# Patient Record
Sex: Female | Born: 1935 | Race: White | Hispanic: No | Smoking: Current every day smoker
Health system: Southern US, Community
[De-identification: ages and names within clinical notes are randomized; demographics above are authoritative.]

## PROBLEM LIST (undated history)

## (undated) ENCOUNTER — Emergency Department (HOSPITAL_COMMUNITY): Disposition: A | Discharge status: Home / Self Care | Payer: Medicare Other

## (undated) DIAGNOSIS — M1990 Unspecified osteoarthritis, unspecified site: Secondary | ICD-10-CM

## (undated) DIAGNOSIS — I639 Cerebral infarction, unspecified: Secondary | ICD-10-CM

## (undated) DIAGNOSIS — F0390 Unspecified dementia without behavioral disturbance: Secondary | ICD-10-CM

---

## 2005-09-27 ENCOUNTER — Emergency Department: Payer: Self-pay | Admitting: Emergency Medicine

## 2005-09-28 ENCOUNTER — Emergency Department (HOSPITAL_COMMUNITY): Admission: EM | Admit: 2005-09-28 | Discharge: 2005-09-28 | Payer: Self-pay | Admitting: Emergency Medicine

## 2005-09-29 ENCOUNTER — Emergency Department (HOSPITAL_COMMUNITY): Admission: EM | Admit: 2005-09-29 | Discharge: 2005-09-29 | Payer: Self-pay | Admitting: Emergency Medicine

## 2005-12-03 ENCOUNTER — Ambulatory Visit: Payer: Self-pay | Admitting: Internal Medicine

## 2008-08-30 ENCOUNTER — Ambulatory Visit: Payer: Self-pay | Admitting: Internal Medicine

## 2009-03-06 ENCOUNTER — Encounter: Admission: RE | Admit: 2009-03-06 | Discharge: 2009-03-06 | Payer: Self-pay | Admitting: Neurology

## 2009-05-20 ENCOUNTER — Ambulatory Visit: Payer: Self-pay | Admitting: Psychology

## 2009-07-18 ENCOUNTER — Inpatient Hospital Stay: Payer: Self-pay | Admitting: Internal Medicine

## 2013-05-03 DIAGNOSIS — M069 Rheumatoid arthritis, unspecified: Secondary | ICD-10-CM | POA: Insufficient documentation

## 2013-05-24 DIAGNOSIS — H3530 Unspecified macular degeneration: Secondary | ICD-10-CM | POA: Insufficient documentation

## 2014-01-26 ENCOUNTER — Ambulatory Visit: Payer: Self-pay | Admitting: Internal Medicine

## 2014-12-20 DIAGNOSIS — E559 Vitamin D deficiency, unspecified: Secondary | ICD-10-CM | POA: Insufficient documentation

## 2015-01-21 ENCOUNTER — Other Ambulatory Visit: Payer: Self-pay | Admitting: Physician Assistant

## 2015-01-21 ENCOUNTER — Ambulatory Visit
Admission: RE | Admit: 2015-01-21 | Discharge: 2015-01-21 | Disposition: A | Discharge status: Home / Self Care | Payer: Medicare Other | Source: Ambulatory Visit | Attending: Physician Assistant | Admitting: Physician Assistant

## 2015-01-21 ENCOUNTER — Ambulatory Visit: Payer: Self-pay

## 2015-01-21 DIAGNOSIS — S0990XA Unspecified injury of head, initial encounter: Secondary | ICD-10-CM

## 2015-01-21 DIAGNOSIS — G319 Degenerative disease of nervous system, unspecified: Secondary | ICD-10-CM | POA: Diagnosis not present

## 2015-01-21 DIAGNOSIS — I6782 Cerebral ischemia: Secondary | ICD-10-CM | POA: Diagnosis not present

## 2015-01-22 ENCOUNTER — Ambulatory Visit: Payer: Self-pay

## 2017-12-27 DIAGNOSIS — E441 Mild protein-calorie malnutrition: Secondary | ICD-10-CM | POA: Insufficient documentation

## 2018-12-28 ENCOUNTER — Observation Stay (HOSPITAL_COMMUNITY)
Admission: EM | Admit: 2018-12-28 | Discharge: 2018-12-29 | Disposition: A | Discharge status: Home / Self Care | Payer: Medicare Other | Attending: Student in an Organized Health Care Education/Training Program | Admitting: Student in an Organized Health Care Education/Training Program

## 2018-12-28 ENCOUNTER — Emergency Department (HOSPITAL_COMMUNITY): Payer: Medicare Other

## 2018-12-28 ENCOUNTER — Other Ambulatory Visit (HOSPITAL_COMMUNITY): Payer: Medicare Other

## 2018-12-28 ENCOUNTER — Observation Stay (HOSPITAL_COMMUNITY): Payer: Medicare Other

## 2018-12-28 ENCOUNTER — Encounter (HOSPITAL_COMMUNITY): Payer: Self-pay | Admitting: Neurology

## 2018-12-28 ENCOUNTER — Other Ambulatory Visit: Payer: Self-pay

## 2018-12-28 DIAGNOSIS — R4781 Slurred speech: Secondary | ICD-10-CM | POA: Diagnosis present

## 2018-12-28 DIAGNOSIS — I639 Cerebral infarction, unspecified: Secondary | ICD-10-CM | POA: Diagnosis not present

## 2018-12-28 DIAGNOSIS — M069 Rheumatoid arthritis, unspecified: Secondary | ICD-10-CM | POA: Diagnosis not present

## 2018-12-28 DIAGNOSIS — Z79899 Other long term (current) drug therapy: Secondary | ICD-10-CM | POA: Diagnosis not present

## 2018-12-28 DIAGNOSIS — R471 Dysarthria and anarthria: Secondary | ICD-10-CM | POA: Diagnosis not present

## 2018-12-28 DIAGNOSIS — F0150 Vascular dementia without behavioral disturbance: Secondary | ICD-10-CM | POA: Diagnosis present

## 2018-12-28 DIAGNOSIS — I6389 Other cerebral infarction: Secondary | ICD-10-CM | POA: Diagnosis not present

## 2018-12-28 DIAGNOSIS — F17200 Nicotine dependence, unspecified, uncomplicated: Secondary | ICD-10-CM | POA: Diagnosis present

## 2018-12-28 DIAGNOSIS — Z886 Allergy status to analgesic agent status: Secondary | ICD-10-CM | POA: Diagnosis not present

## 2018-12-28 DIAGNOSIS — R29810 Facial weakness: Secondary | ICD-10-CM | POA: Insufficient documentation

## 2018-12-28 DIAGNOSIS — Z20828 Contact with and (suspected) exposure to other viral communicable diseases: Secondary | ICD-10-CM | POA: Insufficient documentation

## 2018-12-28 DIAGNOSIS — M810 Age-related osteoporosis without current pathological fracture: Secondary | ICD-10-CM | POA: Insufficient documentation

## 2018-12-28 DIAGNOSIS — F0390 Unspecified dementia without behavioral disturbance: Secondary | ICD-10-CM | POA: Insufficient documentation

## 2018-12-28 HISTORY — DX: Unspecified osteoarthritis, unspecified site: M19.90

## 2018-12-28 HISTORY — DX: Unspecified dementia, unspecified severity, without behavioral disturbance, psychotic disturbance, mood disturbance, and anxiety: F03.90

## 2018-12-28 LAB — I-STAT CHEM 8, ED
BUN: 29 mg/dL — ABNORMAL HIGH (ref 8–23)
Calcium, Ion: 1.18 mmol/L (ref 1.15–1.40)
Chloride: 101 mmol/L (ref 98–111)
Creatinine, Ser: 1 mg/dL (ref 0.44–1.00)
Glucose, Bld: 124 mg/dL — ABNORMAL HIGH (ref 70–99)
HCT: 45 % (ref 36.0–46.0)
Hemoglobin: 15.3 g/dL — ABNORMAL HIGH (ref 12.0–15.0)
Potassium: 4.1 mmol/L (ref 3.5–5.1)
Sodium: 137 mmol/L (ref 135–145)
TCO2: 24 mmol/L (ref 22–32)

## 2018-12-28 LAB — URINALYSIS, ROUTINE W REFLEX MICROSCOPIC
Bacteria, UA: NONE SEEN
Bilirubin Urine: NEGATIVE
Glucose, UA: NEGATIVE mg/dL
Hgb urine dipstick: NEGATIVE
Ketones, ur: NEGATIVE mg/dL
Nitrite: NEGATIVE
Protein, ur: NEGATIVE mg/dL
Specific Gravity, Urine: 1.01 (ref 1.005–1.030)
pH: 6 (ref 5.0–8.0)

## 2018-12-28 LAB — COMPREHENSIVE METABOLIC PANEL
ALT: 14 U/L (ref 0–44)
AST: 19 U/L (ref 15–41)
Albumin: 3.5 g/dL (ref 3.5–5.0)
Alkaline Phosphatase: 63 U/L (ref 38–126)
Anion gap: 10 (ref 5–15)
BUN: 26 mg/dL — ABNORMAL HIGH (ref 8–23)
CO2: 22 mmol/L (ref 22–32)
Calcium: 9.3 mg/dL (ref 8.9–10.3)
Chloride: 104 mmol/L (ref 98–111)
Creatinine, Ser: 1.05 mg/dL — ABNORMAL HIGH (ref 0.44–1.00)
GFR calc Af Amer: 57 mL/min — ABNORMAL LOW (ref >60)
GFR calc non Af Amer: 49 mL/min — ABNORMAL LOW (ref >60)
Glucose, Bld: 133 mg/dL — ABNORMAL HIGH (ref 70–99)
Potassium: 4.2 mmol/L (ref 3.5–5.1)
Sodium: 136 mmol/L (ref 135–145)
Total Bilirubin: 0.4 mg/dL (ref 0.3–1.2)
Total Protein: 6.6 g/dL (ref 6.5–8.1)

## 2018-12-28 LAB — DIFFERENTIAL
Abs Immature Granulocytes: 0.02 10*3/uL (ref 0.00–0.07)
Basophils Absolute: 0.1 10*3/uL (ref 0.0–0.1)
Basophils Relative: 1 %
Eosinophils Absolute: 0.1 10*3/uL (ref 0.0–0.5)
Eosinophils Relative: 1 %
Immature Granulocytes: 0 %
Lymphocytes Relative: 25 %
Lymphs Abs: 1.9 10*3/uL (ref 0.7–4.0)
Monocytes Absolute: 0.6 10*3/uL (ref 0.1–1.0)
Monocytes Relative: 8 %
Neutro Abs: 4.9 10*3/uL (ref 1.7–7.7)
Neutrophils Relative %: 65 %

## 2018-12-28 LAB — PROTIME-INR
INR: 0.9 (ref 0.8–1.2)
Prothrombin Time: 12.4 seconds (ref 11.4–15.2)

## 2018-12-28 LAB — CBC
HCT: 44.9 % (ref 36.0–46.0)
Hemoglobin: 14.6 g/dL (ref 12.0–15.0)
MCH: 34.2 pg — ABNORMAL HIGH (ref 26.0–34.0)
MCHC: 32.5 g/dL (ref 30.0–36.0)
MCV: 105.2 fL — ABNORMAL HIGH (ref 80.0–100.0)
Platelets: 263 10*3/uL (ref 150–400)
RBC: 4.27 MIL/uL (ref 3.87–5.11)
RDW: 13.3 % (ref 11.5–15.5)
WBC: 7.5 10*3/uL (ref 4.0–10.5)
nRBC: 0 % (ref 0.0–0.2)

## 2018-12-28 LAB — APTT: aPTT: 31 seconds (ref 24–36)

## 2018-12-28 LAB — CBG MONITORING, ED: Glucose-Capillary: 120 mg/dL — ABNORMAL HIGH (ref 70–99)

## 2018-12-28 LAB — SARS CORONAVIRUS 2 (TAT 6-24 HRS): SARS Coronavirus 2: NEGATIVE

## 2018-12-28 MED ORDER — ACETAMINOPHEN 650 MG RE SUPP: 650.0000 mg | Freq: Four times a day (QID) | RECTAL | Status: DC | PRN

## 2018-12-28 MED ORDER — SODIUM CHLORIDE 0.9% FLUSH
3.0000 mL | Freq: Once | INTRAVENOUS | Status: DC
Start: 2018-12-28 — End: 2018-12-29

## 2018-12-28 MED ORDER — ATORVASTATIN CALCIUM 80 MG PO TABS
80.0000 mg | ORAL_TABLET | Freq: Every day | ORAL | Status: DC
  Administered 2018-12-28: 22:00:00 80 mg via ORAL
  Filled 2018-12-28: qty 1

## 2018-12-28 MED ORDER — ASPIRIN EC 325 MG PO TBEC
325.0000 mg | DELAYED_RELEASE_TABLET | Freq: Every day | ORAL | Status: DC
  Administered 2018-12-29: 10:00:00 325 mg via ORAL
  Filled 2018-12-28: qty 1

## 2018-12-28 MED ORDER — SENNOSIDES-DOCUSATE SODIUM 8.6-50 MG PO TABS: 1.0000 | ORAL_TABLET | Freq: Every evening | ORAL | Status: DC | PRN

## 2018-12-28 MED ORDER — VITAMIN D 25 MCG (1000 UNIT) PO TABS
1000.0000 [IU] | ORAL_TABLET | Freq: Every day | ORAL | Status: DC
  Administered 2018-12-29: 10:00:00 1000 [IU] via ORAL
  Filled 2018-12-28: qty 1

## 2018-12-28 MED ORDER — HEPARIN SODIUM (PORCINE) 5000 UNIT/ML IJ SOLN
5000.0000 [IU] | Freq: Three times a day (TID) | INTRAMUSCULAR | Status: DC
  Administered 2018-12-28 – 2018-12-29 (×3): 5000 [IU] via SUBCUTANEOUS
  Filled 2018-12-28 (×3): qty 1

## 2018-12-28 MED ORDER — ACETAMINOPHEN 325 MG PO TABS: 650.0000 mg | ORAL_TABLET | Freq: Four times a day (QID) | ORAL | Status: DC | PRN

## 2018-12-28 MED ORDER — PROMETHAZINE HCL 25 MG PO TABS: 12.5000 mg | ORAL_TABLET | Freq: Four times a day (QID) | ORAL | Status: DC | PRN

## 2018-12-28 MED ORDER — STROKE: EARLY STAGES OF RECOVERY BOOK
Freq: Once | Status: DC
  Filled 2018-12-28: qty 1

## 2018-12-28 MED ORDER — DONEPEZIL HCL 10 MG PO TABS
10.0000 mg | ORAL_TABLET | Freq: Every day | ORAL | Status: DC
  Administered 2018-12-29: 10:00:00 10 mg via ORAL
  Filled 2018-12-28 (×2): qty 1

## 2018-12-28 MED ORDER — IOHEXOL 350 MG/ML SOLN
100.0000 mL | Freq: Once | INTRAVENOUS | Status: AC | PRN
  Administered 2018-12-28: 100 mL via INTRAVENOUS

## 2018-12-28 MED ORDER — METHOTREXATE 2.5 MG PO TABS: 15.0000 mg | ORAL_TABLET | ORAL | Status: DC

## 2018-12-28 NOTE — ED Notes (Signed)
Family at bedside. 

## 2018-12-28 NOTE — Code Documentation (Signed)
Patient arrived to ED via [place], last know well [phone], where sister saw patient. At [place] daughter noticed patient to have slurred speech and left sided weakness. Upon arrival patient noted to have slurred speech, left facial droop, per EMS left hand weakness. Patient transported to CT with neurologist, CT completed. NIH 4, for facial droop, dysarthria, and questions. Symptoms mild, and remain in the window to treat if symptoms were to worsen until 11:20. Handoff to [employer]. Lianne Bushy RN BSN.

## 2018-12-28 NOTE — H&P (Addendum)
Date: 12/28/2018               Patient Name:  Christine Mccullough MRN: [id number]  DOB: [birth year] Age / Sex: 84 y.o., female   PCP: Adin Hector, MD         Medical Service: Internal Medicine Teaching Service         Attending Physician: Dr. Evette Doffing, Mallie Mussel, *    First Contact: Dr. Marva Panda Pager: [phone]  Second Contact: Dr. Eileen Stanford Pager: [phone]       After Hours (After 5p/  First Contact Pager: [phone]  weekends / holidays): Second Contact Pager: [phone]   Chief Complaint: left facial droop, slurred speech   History of Present Illness:  Christine Mccullough is a 84 y.o female with rheumatoid arthritis, osteoporosis, dementia who presented as a code stroke with slurred speech and left facial droop this morning 12/28/18. The patient was reportedly doing well and at her baseline at 6:30am when the home health nurse came. However, few minutes later around 6:50am when the patient's daughter came she was slurring her words and had a left sided facial droop.  EMS reported that the patient's systolic bp was initially 240 but was subsequently 140 en route to the hospital.  Patient was seen by stroke team in the ED. She had a NIH stroke scale of 3. She was afebrile, with normal pulse ranging 60-70s, normal respirations, normotensive, and saturating on room air. CT scan did not show any acute intracranial abnormalities. MRI was done and read is pending.   Meds:  Current Meds  Medication Sig  . Cholecalciferol (VITAMIN D-1000 MAX ST) 25 MCG (1000 UT) tablet Take 1,000 Units by mouth daily.  Marland Kitchen donepezil (ARICEPT) 10 MG tablet Take 10 mg by mouth daily.  . folic acid (FOLVITE) 1 MG tablet Take 1 mg by mouth daily.  Marland Kitchen ibuprofen (ADVIL) 200 MG tablet Take 400-600 mg by mouth every 6 (six) hours as needed for fever, headache or mild pain.  . methotrexate 2.5 MG tablet Take 15 mg by mouth every Sunday.      Allergies: Allergies as of 12/28/2018 - Review Complete 12/28/2018  Allergen Reaction  Noted  . Codeine Other (See Comments) 05/03/2013  . Propoxyphene Nausea And Vomiting 05/03/2013  . Pneumococcal polysaccharide vaccine Rash 12/20/2014   Past Medical History:  Diagnosis Date  . Arthritis   . Dementia [facility])     Family History:   Maternal aunt with DM  Social History:   Lives alone in [place] Is able to shower and dress herself. Can make herself coffee and heat up food. Cannot drive.  Smokes approximately 1/2ppd for the past 20 yrs  Drinks wine occasionally  No drug use  Review of Systems: A complete ROS was negative except as per HPI.   Physical Exam: Blood pressure 129/78, pulse 69, temperature 98.7 F (37.1 C), temperature source Oral, resp. rate 18, height 5\' 3"  (1.6 m), weight 46.7 kg, SpO2 98 %.  Physical Exam  Constitutional: She is oriented to person, place, and time. She appears well-developed and well-nourished. No distress.  HENT:  Head: Normocephalic and atraumatic.  Eyes: Conjunctivae are normal.  Cardiovascular: Normal rate, regular rhythm and normal heart sounds.  Respiratory: Effort normal and breath sounds normal. No respiratory distress. She has no wheezes.  GI: Soft. Bowel sounds are normal. She exhibits no distension. There is no abdominal tenderness.  Musculoskeletal:        General: No edema.  Neurological: She  is alert and oriented to person, place, and time. No cranial nerve deficit. Coordination normal.  Skin: She is not diaphoretic. No erythema.  Psychiatric: She has a normal mood and affect. Her behavior is normal. Judgment and thought content normal.   Neuro: Mental Status: Patient is awake, alert, oriented to person, place, month, year, and situation. Patient does not recall what happened No signs of aphasia or neglect Cranial Nerves: II: Visual Fields are full. Pupils are equal, round, and reactive to light.   III,IV, VI: EOMI without ptosis or diploplia.  V: Facial sensation is symmetric to temperature VII: Facial  movement is symmetric.  VIII: hearing is intact to voice X: Uvula elevates symmetrically XI: Shoulder shrug is symmetric. XII: tongue is midline without atrophy or fasciculations.  Motor: Tone is normal. Bulk is normal. 4/5 strength in left upper extremity and bilateral lower extremities. 5/5 strength in right upper extremity Sensory: Sensation is symmetric to light touch and temperature in the arms and legs. Deep Tendon Reflexes: 2+ and symmetric in the biceps and patellae.  Plantars: Toes are downgoing bilaterally.  Cerebellar: FNF intact bilaterally  EKG: personally reviewed my interpretation is normal sinus rhythm without st or t wave changes.   CXR: not done   Assessment & Plan by Problem: Active Problems:   Acute ischemic stroke [facility])  Christine Mccullough is a 84 y.o female with dementia, rheumatoid arthritis, osteoporosis who presented with acute onset slurred speech and left facial droop.   Probable acute small vessel stroke Patient continues to have deficits-mild left side facial droop and mild dysarthria. Patient does not have significant known risk factors for cva (DM, HTN, HLD).   CT head withour acute intracranial abnormality, atrophy and chronic microvascular ischemia. CT head and neck showing aca, mca, pca, basilar, and vertebral artery patency. No large vessel occlusion or stenosis.   Will get MRI to further characterize cause of stroke.   -MRI wo contrast pending -NPO till pass swallow eval  -aspirin 325mg  qd  -atorvastatin 80mg  qhs -TTE -Cardiac monitoring  -A1C -Lipid profile  -PT/OT -Speech therapy    Dementia  According to daughter the patient is slow to process information.   -continue aricept   Rheumatoid arthritis  She follows with rheumatology. States that she has joint pains and morning stiffness.    -continue methotrexate 2.5mg  every Sunday   Osteoporosis  Patient states that she has been scheduled to get bone density scan done outpatient and is  supposed to have an infusion.   -continue cholecalciferol 1000u qd    Dispo: Admit patient to Observation with expected length of stay less than 2 midnights.  Signed , MD 12/28/2018, 1:21 PM  Pager: [phone]

## 2018-12-28 NOTE — Consult Note (Addendum)
Neurology Consultation  Reason for Consult: Code stroke Referring Physician:Nanavati   CC: Left facial droop, slurred speech  History is obtained from: Family   HPI: Christine Mccullough is a 84 y.o. female with history of dementia, rheumatoid arthritis.  Patient's family noticed this morning at approximately 715 she was having slurred speech and left facial droop.  She was last seen normal at 650 in the morning.  EMS was called and patient was brought to [facility].  Code stroke was called.  EMS advised that when the fire department arrived on scene her systolic blood pressure was 240.  When taken in route by EMS it was 136/78.  Blood glucose was 336.  Patient was not aware of her symptoms.  Thus, was not too keen at being at the hospital.  She was able to follow all commands and answer all questions asked of her.    ED course  -NIH stroke scale -Physical exam -CT of head   Chart review-there is no neurological note found in epic  LKW: 6:50 AM tpa given?: no, minimal symptoms Premorbid modified Rankin scale (mRS): 0   NIH stroke score: 3 --, dysarthria, left facial droop, answered month wrong   Past Medical History:  Diagnosis Date  . Arthritis   . Dementia (HCC)      Family History  Problem Relation Age of Onset  . Hypertension Mother   . Hypertension Father    Social History:   has no history on file for tobacco, alcohol, and drug.  Medications  Current Facility-Administered Medications:  .  sodium chloride flush (NS) 0.9 % injection 3 mL, 3 mL, Intravenous, Once, Nanavati, Ankit, MD No current outpatient medications on file.   Exam: Current vital signs: BP 125/75   Pulse 69   Temp 98.7 F (37.1 C) (Oral)   Resp 18   Ht 5\' 3"  (1.6 m)   Wt 46.7 kg   SpO2 98%   BMI 18.25 kg/m  Vital signs in last 24 hours: Temp:  [98.7 F (37.1 C)] 98.7 F (37.1 C) (11/11 0845) Pulse Rate:  [69] 69 (11/11 0845) Resp:  [17-21] 18 (11/11 0900) BP:  (116-125)/(68-75) 125/75 (11/11 0900) SpO2:  [98 %] 98 % (11/11 0845) Weight:  [46.7 kg] 46.7 kg (11/11 0848)  ROS:    General ROS: negative for - chills, fatigue, fever, night sweats, weight gain or weight loss Psychological ROS: negative for - behavioral disorder, hallucinations, memory difficulties, mood swings or suicidal ideation Ophthalmic ROS: negative for - blurry vision, double vision, eye pain or loss of vision ENT ROS: negative for - epistaxis, nasal discharge, oral lesions, sore throat, tinnitus or vertigo Allergy and Immunology ROS: negative for - hives or itchy/watery eyes Hematological and Lymphatic ROS: negative for - bleeding problems, bruising or swollen lymph nodes Endocrine ROS: negative for - galactorrhea, hair pattern changes, polydipsia/polyuria or temperature intolerance Respiratory ROS: negative for - cough, hemoptysis, shortness of breath or wheezing Cardiovascular ROS: negative for - chest pain, dyspnea on exertion, edema or irregular heartbeat Gastrointestinal ROS: negative for - abdominal pain, diarrhea, hematemesis, nausea/vomiting or stool incontinence Genito-Urinary ROS: negative for - dysuria, hematuria, incontinence or urinary frequency/urgency Musculoskeletal ROS: Positive for - joint swelling and pain secondary to rheumatoid arthritis Neurological ROS: as noted in HPI Dermatological ROS: negative for rash and skin lesion changes   Physical Exam   Constitutional: Appears well-developed and well-nourished.  Psych: Affect appropriate to situation Eyes: No scleral injection HENT: No OP obstrucion Head: Normocephalic.  Cardiovascular: Normal rate and regular rhythm.  Respiratory: Effort normal, non-labored breathing GI: Soft.  No distension. There is no tenderness.  Skin: WDI  Neuro: Mental Status: Patient is awake, alert, oriented to self, age, birth date and year however she did get the month wrong.  Able to follow all commands without difficulty.   She does show dysarthria but no aphasia Cranial Nerves: II: Visual Fields are full.  III,IV, VI: EOMI without ptosis or diploplia. Pupils equal, round and reactive to light V: Facial sensation is symmetric to temperature VII: Left facial droop VIII: hearing is intact to voice X: Palat elevates symmetrically XI: Shoulder shrug is symmetric. XII: tongue is midline without atrophy or fasciculations.  Motor: Tone is normal. Bulk is normal. 5/5 strength was present in all four extremities.  Sensory: Sensation is symmetric to light touch and temperature in the arms and legs. Deep Tendon Reflexes: 2+ and symmetric in the biceps and patellae.  Plantars: Toes are downgoing bilaterally.  Cerebellar: FNF and HKS are intact bilaterally  Labs I have reviewed labs in epic and the results pertinent to this consultation are:   CBC    Component Value Date/Time   WBC 7.5 12/28/2018 0821   RBC 4.27 12/28/2018 0821   HGB 14.6 12/28/2018 0821   HCT 44.9 12/28/2018 0821   PLT 263 12/28/2018 0821   MCV 105.2 (H) 12/28/2018 0821   MCH 34.2 (H) 12/28/2018 0821   MCHC 32.5 12/28/2018 0821   RDW 13.3 12/28/2018 0821   LYMPHSABS 1.9 12/28/2018 0821   MONOABS 0.6 12/28/2018 0821   EOSABS 0.1 12/28/2018 0821   BASOSABS 0.1 12/28/2018 0821    CMP  No results found for: NA, K, CL, CO2, GLUCOSE, BUN, CREATININE, CALCIUM, PROT, ALBUMIN, AST, ALT, ALKPHOS, BILITOT, GFRNONAA, GFRAA  Lipid Panel  No results found for: CHOL, TRIG, HDL, CHOLHDL, VLDL, LDLCALC, LDLDIRECT   Imaging I have reviewed the images obtained:  CT-scan of the brain -no acute intracranial abnormality.  Atrophy and chronic microvascular ischemia  MRI examination of the brain-pending  Christine Quill PA-C [facility] [phone]  M-F  (9:00 am- 5:00 PM)  12/28/2018, 9:02 AM   Assessment:  84 year old female presenting as code stroke secondary to left facial droop, dysarthria and left-sided weakness.  CTA at this  time does not show any acute abnormalities or acute stroke.  Due to NIH stroke scale of 3 and nonlife altering symptoms patient was not given TPA.  Patient likely has suffered a acute small vessel stroke.  Patient would benefit from full stroke work-up.   Impression: -Dysarthria -Left facial droop -Stroke  Recommend # MRI of the brain without contrast #CTA of head and neck #Transthoracic Echo,  # Start patient on ASA 325mg  daily,  #Start  Atorvastatin 80 mg/other high intensity statin # BP goal: permissive HTN upto 220/120 mmHg # HBAIC and Lipid profile # Telemetry monitoring # Frequent neuro checks # NPO until passes stroke swallow screen # please page stroke NP  Or  PA  Or MD from 8am -4 pm  as this patient from this time will be  followed by the stroke.   You can look them up on [email or web address]  Password [email or web address]    NEUROHOSPITALIST ADDENDUM Performed a face to face diagnostic evaluation.   I have reviewed the contents of history and physical exam as documented by PA/ARNP/Resident and agree with above documentation.  I have discussed and formulated the above plan as documented. Edits to the note have been made as  needed.  84 year old female with past medical history of dementia presents to the emergency department after daughter noticed left-sided weakness and slurred speech.  She was seen by her caregiver 6:30 AM this morning without any abnormalities and later on 7 AM was noticed to have slurred speech and facial droop.  She arrived as a code stroke.  Blood pressure initially was 240 systolic according to EMS however repeat BP was around 140 systolic.   Patient likely has small vessel stroke, current deficits are mild with only minimal dysarthria, facial droop.  We will hold off administering IV TPA due to mild nondisabling deficits.  Recommend admission for stroke work-up.      Georgiana Spinner Vernell Back MD [facility] [phone]   If 7pm to 7am, please call on call as listed  on AMION.

## 2018-12-28 NOTE — ED Triage Notes (Signed)
To ED via [facility] from home with family stating pt's speech became slurred at approx 0715. Lives with daughter- daughter saw pt at 26 and pt was normal.  Pt has hx of dementia-- is able to answer questions, confused as to month-- possibly normal for patient. Able to follow all commands

## 2018-12-28 NOTE — ED Notes (Signed)
Returned from MRI -- is hungry-- will page MD

## 2018-12-28 NOTE — ED Provider Notes (Signed)
[facility] EMERGENCY DEPARTMENT Provider Note   CSN: [id number] Arrival date & time: 12/28/18  1017     History   Chief Complaint Chief Complaint  Patient presents with  . Code Stroke    HPI Thurza Kwiecinski is a 84 y.o. female.     HPI  84 year old female comes in a chief complaint of left-sided weakness and slurred speech. Patient has history of dementia and there is no history of prior strokes.  I spoke with patient's daughter, Ms. McDonald, who confirms that patient was last seen normal by her at 6:45 PM yesterday night.  This morning patient's caregiver arrived at 6:30 AM and did not witness any abnormalities.  When she arrived at 7 AM she noted that patient was having slurred speech and slight facial droop, again this was new per caregiver.  Patient has no complaints from her side.  No past medical history on file.  There are no active problems to display for this patient.   OB History   No obstetric history on file.      Home Medications    Prior to Admission medications   Not on File    Family History No family history on file.  Social History Social History   Tobacco Use  . Smoking status: Not on file  Substance Use Topics  . Alcohol use: Not on file  . Drug use: Not on file     Allergies   Patient has no allergy information on record.   Review of Systems Review of Systems  Unable to perform ROS: Acuity of condition  Constitutional: Positive for activity change.  Respiratory: Negative for shortness of breath.   Cardiovascular: Negative for chest pain.  Gastrointestinal: Negative for nausea and vomiting.  Neurological: Positive for speech difficulty and weakness.     Physical Exam Updated Vital Signs There were no vitals taken for this visit.  Physical Exam Vitals signs and nursing note reviewed.  Constitutional:      Appearance: She is well-developed.  HENT:     Head: Normocephalic and atraumatic.  Neck:    Musculoskeletal: Normal range of motion and neck supple.  Cardiovascular:     Rate and Rhythm: Normal rate.  Pulmonary:     Effort: Pulmonary effort is normal.  Abdominal:     General: Bowel sounds are normal.  Musculoskeletal:     Comments: Speech is slurred  Skin:    General: Skin is warm and dry.  Neurological:     Mental Status: She is alert and oriented to person, place, and time.      ED Treatments / Results  Labs (all labs ordered are listed, but only abnormal results are displayed) Labs Reviewed  CBC - Abnormal; Notable for the following components:      Result Value   MCV 105.2 (*)    MCH 34.2 (*)    All other components within normal limits  SARS CORONAVIRUS 2 (TAT 6-24 HRS)  DIFFERENTIAL  PROTIME-INR  APTT  COMPREHENSIVE METABOLIC PANEL  I-STAT CHEM 8, ED  CBG MONITORING, ED    EKG EKG Interpretation  Date/Time:  Wednesday December 28 2018 08:40:14 EST Ventricular Rate:  70 PR Interval:    QRS Duration: 89 QT Interval:  384 QTC Calculation: 415 R Axis:   52 Text Interpretation: Sinus rhythm RSR' in V1 or V2, right VCD or RVH No acute changes No significant change since last tracing Confirmed by Derwood Kaplan [id number]) on 12/28/2018 8:42:52 AM   Radiology  No results found.  Procedures Procedures (including critical care time)  Medications Ordered in ED Medications  sodium chloride flush (NS) 0.9 % injection 3 mL (has no administration in time range)     Initial Impression / Assessment and Plan / ED Course  I have reviewed the triage vital signs and the nursing notes.  Pertinent labs & imaging results that were available during my care of the patient were reviewed by me and considered in my medical decision making (see chart for details).        DDx includes:  Stroke - ischemic vs. hemorrhagic TIA Neuropathy Myelitis Electrolyte abnormality Neuropathy Muscular disease  Patient comes in with chief complaint of slurred speech and  facial droop.  Last seen normal at 6:30 AM.  Stroke team at the bedside.  Patient symptoms are improving and are not severe therefore she is not a TPA candidate.  History is confirmed with patient's daughter who is on the way to the ER.  Final Clinical Impressions(s) / ED Diagnoses   Final diagnoses:  Ischemic stroke [facility])    ED Discharge Orders    None       Varney Biles, MD 12/28/18 [phone]

## 2018-12-29 ENCOUNTER — Observation Stay (HOSPITAL_BASED_OUTPATIENT_CLINIC_OR_DEPARTMENT_OTHER): Payer: Medicare Other

## 2018-12-29 ENCOUNTER — Encounter (HOSPITAL_COMMUNITY): Payer: Self-pay | Admitting: *Deleted

## 2018-12-29 DIAGNOSIS — I6359 Cerebral infarction due to unspecified occlusion or stenosis of other cerebral artery: Secondary | ICD-10-CM

## 2018-12-29 DIAGNOSIS — Z7982 Long term (current) use of aspirin: Secondary | ICD-10-CM

## 2018-12-29 DIAGNOSIS — I6389 Other cerebral infarction: Secondary | ICD-10-CM | POA: Diagnosis not present

## 2018-12-29 DIAGNOSIS — I361 Nonrheumatic tricuspid (valve) insufficiency: Secondary | ICD-10-CM

## 2018-12-29 DIAGNOSIS — F17200 Nicotine dependence, unspecified, uncomplicated: Secondary | ICD-10-CM | POA: Diagnosis present

## 2018-12-29 DIAGNOSIS — R29810 Facial weakness: Secondary | ICD-10-CM | POA: Diagnosis not present

## 2018-12-29 DIAGNOSIS — F0150 Vascular dementia without behavioral disturbance: Secondary | ICD-10-CM

## 2018-12-29 DIAGNOSIS — Z7902 Long term (current) use of antithrombotics/antiplatelets: Secondary | ICD-10-CM

## 2018-12-29 DIAGNOSIS — M810 Age-related osteoporosis without current pathological fracture: Secondary | ICD-10-CM | POA: Diagnosis not present

## 2018-12-29 DIAGNOSIS — M069 Rheumatoid arthritis, unspecified: Secondary | ICD-10-CM

## 2018-12-29 DIAGNOSIS — F17210 Nicotine dependence, cigarettes, uncomplicated: Secondary | ICD-10-CM

## 2018-12-29 DIAGNOSIS — I639 Cerebral infarction, unspecified: Secondary | ICD-10-CM | POA: Diagnosis not present

## 2018-12-29 DIAGNOSIS — Z887 Allergy status to serum and vaccine status: Secondary | ICD-10-CM

## 2018-12-29 DIAGNOSIS — Z885 Allergy status to narcotic agent status: Secondary | ICD-10-CM

## 2018-12-29 DIAGNOSIS — R471 Dysarthria and anarthria: Secondary | ICD-10-CM | POA: Diagnosis present

## 2018-12-29 DIAGNOSIS — Z79899 Other long term (current) drug therapy: Secondary | ICD-10-CM

## 2018-12-29 LAB — BASIC METABOLIC PANEL
Anion gap: 8 (ref 5–15)
BUN: 21 mg/dL (ref 8–23)
CO2: 22 mmol/L (ref 22–32)
Calcium: 9.1 mg/dL (ref 8.9–10.3)
Chloride: 106 mmol/L (ref 98–111)
Creatinine, Ser: 1.05 mg/dL — ABNORMAL HIGH (ref 0.44–1.00)
GFR calc Af Amer: 57 mL/min — ABNORMAL LOW (ref >60)
GFR calc non Af Amer: 49 mL/min — ABNORMAL LOW (ref >60)
Glucose, Bld: 87 mg/dL (ref 70–99)
Potassium: 4.4 mmol/L (ref 3.5–5.1)
Sodium: 136 mmol/L (ref 135–145)

## 2018-12-29 LAB — CBC
HCT: 41.7 % (ref 36.0–46.0)
Hemoglobin: 14.2 g/dL (ref 12.0–15.0)
MCH: 34.1 pg — ABNORMAL HIGH (ref 26.0–34.0)
MCHC: 34.1 g/dL (ref 30.0–36.0)
MCV: 100 fL (ref 80.0–100.0)
Platelets: 280 10*3/uL (ref 150–400)
RBC: 4.17 MIL/uL (ref 3.87–5.11)
RDW: 13.1 % (ref 11.5–15.5)
WBC: 10.3 10*3/uL (ref 4.0–10.5)
nRBC: 0 % (ref 0.0–0.2)

## 2018-12-29 LAB — LIPID PANEL
Cholesterol: 175 mg/dL (ref 0–200)
HDL: 70 mg/dL (ref >40)
LDL Cholesterol: 95 mg/dL (ref 0–99)
Total CHOL/HDL Ratio: 2.5 RATIO
Triglycerides: 52 mg/dL (ref <150)
VLDL: 10 mg/dL (ref 0–40)

## 2018-12-29 LAB — HEMOGLOBIN A1C
Hgb A1c MFr Bld: 5.5 % (ref 4.8–5.6)
Mean Plasma Glucose: 111.15 mg/dL

## 2018-12-29 LAB — ECHOCARDIOGRAM COMPLETE
Height: 63 in
Weight: 1502.66 oz

## 2018-12-29 MED ORDER — ATORVASTATIN CALCIUM 80 MG PO TABS: 80.0000 mg | ORAL_TABLET | Freq: Every day | ORAL | 0 refills | Status: DC

## 2018-12-29 MED ORDER — ASPIRIN EC 81 MG PO TBEC: 81.0000 mg | DELAYED_RELEASE_TABLET | Freq: Every day | ORAL | Status: DC

## 2018-12-29 MED ORDER — ASPIRIN 81 MG PO TBEC: 81.0000 mg | DELAYED_RELEASE_TABLET | Freq: Every day | ORAL | 0 refills | Status: DC

## 2018-12-29 MED ORDER — CLOPIDOGREL BISULFATE 75 MG PO TABS: 75.0000 mg | ORAL_TABLET | Freq: Every day | ORAL | 0 refills | Status: DC

## 2018-12-29 MED ORDER — CLOPIDOGREL BISULFATE 75 MG PO TABS
75.0000 mg | ORAL_TABLET | Freq: Every day | ORAL | Status: DC
  Administered 2018-12-29: 13:00:00 75 mg via ORAL
  Filled 2018-12-29: qty 1

## 2018-12-29 MED ORDER — PNEUMOCOCCAL VAC POLYVALENT 25 MCG/0.5ML IJ INJ: 0.5000 mL | INJECTION | INTRAMUSCULAR | Status: DC

## 2018-12-29 MED ORDER — ATORVASTATIN CALCIUM 40 MG PO TABS
40.0000 mg | ORAL_TABLET | Freq: Every day | ORAL | Status: DC
  Administered 2018-12-29: 40 mg via ORAL
  Filled 2018-12-29: qty 1

## 2018-12-29 NOTE — TOC Transition Note (Signed)
Transition of Care [facility]) - CM/SW Discharge Note   Patient Details  Name: Christine Mccullough MRN: [id number] Date of Birth: 1934/08/16  Transition of Care [facility]) CM/SW Contact:  Pollie Friar, RN Phone Number: 12/29/2018, 3:38 PM   Clinical Narrative:    Pt is discharging home with [facility] services through [facility]. Daughter selected [facility] but they do not have ST in the [place] area. CM notified the daughter and she selected [facility]. Tommi Rumps with [facility] accepted the referral.  Daughter to stay with her after d/c. Daughter to provide transport home.    Final next level of care: Home w Home Health Services Barriers to Discharge: No Barriers Identified   Patient Goals and CMS Choice   CMS Medicare.gov Compare Post Acute Care list provided to:: Patient Represenative (must comment) Choice offered to / list presented to : Adult Children(daughter)  Discharge Placement                       Discharge Plan and Services                          HH Arranged: PT, OT, Nurse's Aide, Speech Therapy HH Agency: [facility] Date [place]: 12/29/18   Representative spoke with at [place]: [facility] Interventions     Readmission Risk Interventions No flowsheet data found.

## 2018-12-29 NOTE — Progress Notes (Signed)
D/C instructions provided to patient, denies questions/concerns at this time. Patient transported to front entrance via WC, tol well. 

## 2018-12-29 NOTE — Evaluation (Signed)
Speech Language Pathology Evaluation Patient Details Name: Christine Mccullough MRN: [id number] DOB: 04-25-1934 Today's Date: 12/29/2018 Time: 3500-9381 SLP Time Calculation (min) (ACUTE ONLY): 25 min  Problem List:  Patient Active Problem List   Diagnosis Date Noted  . Acute ischemic stroke ([place]) 12/28/2018   Past Medical History:  Past Medical History:  Diagnosis Date  . Arthritis   . Dementia [facility])    Past Surgical History: Brain surgery for removal of cyst (2007) per daughter  HPI:  84yo female admitted 12/28/2018 with dysarthria, left side weakness/facial droop. PMH: dementia. RA, osteoporosis. CTHead = no acute abnormalities. MRI = 1cm infarct right corona radiata, extensive atrophy, moderate chronic microhemorrhage likely due to HTN.   Assessment / Plan / Recommendation Clinical Impression  The Mini-Mental State Examination (MMSE) was administered. Pt scured 23/30, indicating mild cognitive impairment. Points were lost on orientation to date, serial 7's, delayed recall. Pt had slight errors with clock drawing task, writing numbers primarily in the right upper quadrant of the circle. Hand placement indicated 11:00, rather than 11:10. Pt's daughter reports pt had brain surgery for removal of a cyst in 2007, which "threw her for a loop". Pt has declined since that time per daughter. Pt lives alone but has caregivers 3x/week. Family lives close by, and are "in and out" per daughter. Daughter manages pt finances and fills pt's med Environmental education officer, but pt takes medications independently after set up. Home health speech therapy may be beneficial to establish routines and compensatory strategies. 24 hour supervision is recommended at DC.    SLP Assessment  SLP Recommendation/Assessment: All further Speech Language Pathology  needs can be addressed in the next venue of care SLP Visit Diagnosis: Cognitive communication deficit (R41.841)    Follow Up Recommendations  24 hour  supervision/assistance;Skilled Nursing facility       SLP Evaluation Cognition  Arousal/Alertness: Awake/alert Orientation Level: Oriented X4 Attention: Focused;Sustained Focused Attention: Appears intact Sustained Attention: Impaired Sustained Attention Impairment: Verbal basic Memory: Impaired Memory Impairment: Decreased recall of new information;Retrieval deficit Immediate Memory Recall: Sock;Blue;Bed Memory Recall Sock: Without Cue Memory Recall Blue: Without Cue Memory Recall Bed: Not able to recall Awareness: Impaired Awareness Impairment: Intellectual impairment Problem Solving: Impaired Problem Solving Impairment: Verbal basic Executive Function: Reasoning;Organizing Reasoning: Impaired Reasoning Impairment: Verbal basic;Functional basic Organizing: Impaired Organizing Impairment: Verbal basic;Functional basic Safety/Judgment: Impaired Comments: Daughter indicates she manages pt's finances, and fills pt pill organizer. Pt then takes meds independently.       Comprehension  Auditory Comprehension Overall Auditory Comprehension: Appears within functional limits for tasks assessed Interfering Components: Working memory;Processing speed EffectiveTechniques: Repetition Reading Comprehension Reading Status: Within funtional limits    Expression Expression Primary Mode of Expression: Verbal Verbal Expression Overall Verbal Expression: Appears within functional limits for tasks assessed Written Expression Dominant Hand: Right Written Expression: Within Functional Limits   Oral / Motor  Oral Motor/Sensory Function Overall Oral Motor/Sensory Function: Within functional limits Motor Speech Overall Motor Speech: Appears within functional limits for tasks assessed   GO                   Enriqueta Shutter, [place], [place] Pathologist Office: [phone] Pager: [phone]  Shonna Chock 12/29/2018, 11:15 AM

## 2018-12-29 NOTE — Plan of Care (Signed)

## 2018-12-29 NOTE — Progress Notes (Signed)
  Echocardiogram 2D Echocardiogram has been performed.  Christine Mccullough 12/29/2018, 9:49 AM

## 2018-12-29 NOTE — Progress Notes (Signed)
OT Cancellation Note  Patient Details Name: Christine Mccullough MRN: [id number] DOB: 02-Oct-1934   Cancelled Treatment:    Reason Eval/Treat Not Completed: Other (comment) Pt just starting work with SLP, will continue to check back as available and appropriate to initiate OT POC.   Zenovia Jarred, MSOT, OTR/L Behavioral Health OT/ Acute Relief OT [facility] Office: [place] 12/29/2018, 10:33 AM

## 2018-12-29 NOTE — Discharge Instructions (Signed)
Ms. Christine Mccullough, Christine Mccullough were admitted to the hospital with an acute stroke. You are being discharged with home health physical therapy and speech therapy.   On discharge, Please start the following medications: Atorvastatin 80mg  daily  Aspirin 81 mg daily Clopidogrel 75mg  daily (for 3 weeks only)  Please schedule an appointment with your PCP within 1-2 weeks of discharge.  Thank you!

## 2018-12-29 NOTE — Progress Notes (Signed)
   Subjective: Christine Mccullough was assessed at bedside this morning. She is resting comfortably in bed. Her daughter is at bedside. Christine Mccullough expresses desire to go home today.    Objective:  Vital signs in last 24 hours: Vitals:   12/28/18 2000 12/28/18 2029 12/29/18 0040 12/29/18 0410  BP:  136/73 130/77 112/73  Pulse: 64 68 66 75  Resp:  18 16 15   Temp:  98.6 F (37 C) 98.8 F (37.1 C) 99 F (37.2 C)  TempSrc:  Oral Oral Oral  SpO2: 94% 95% 96% 95%  Weight:  42.6 kg    Height:  5\' 3"  (1.6 m)     Physical Exam Constitutional:      General: She is not in acute distress.    Appearance: Normal appearance. She is not ill-appearing.  HENT:     Head: Normocephalic and atraumatic.  Skin:    General: Skin is warm and dry.     Capillary Refill: Capillary refill takes less than 2 seconds.     Findings: Bruising present.     Comments: Senile purpura noted on right upper extremity   Neurological:     Mental Status: She is alert and oriented to person, place, and time. Mental status is at baseline.     Sensory: No sensory deficit.     Motor: No weakness.     Comments: Patient has continued dysarthric speech and left facial droop     Assessment/Plan:  Christine Mccullough is an 84yr old female with PMHx of osteoporosis, rheumatoid arthritis and dementia presenting after acute onset of dysarthria and left sided facial droop found to have a right corona radiata infarction.   R corona radiata infarction:  Patient presented with acute onset dysarthria and left sided facial droop. CT head with chronic microvascular ischemia. No significant stenosis of head and neck vessels on CTA head and neck. MRI brain significant for acute infarction of right corona radiata and scattered areas of chronic microhemorrhage in bilateral thalamus, right posterior temporal/occipital and left cerebellum. Patient does not have a history of hypertension, hyperlipidemia, or diabetes mellitus. She does have a significant risk  factor of smoking 1/2 ppd for the past 20 years.  HbA1c 5.5, total cholesterol 175, HDL 70, LDL 95.  Patient counseled on minimizing risk factors for recurrent stroke, including smoking cessation. Discussed that recurrent stroke has possibility of greater debilitation, requiring her to become dependent. Patient's daughter at bedside expressed understanding and agreeable to help patient reduce her risk of recurrent stroke. Although patient lives independently, she does have good support with caregiver visiting her 3 times per week and her children visiting her multiple times throughout the day. She is able to perform her ADLs but does not drive and her daughter takes care of her finances. Patient would likely be safe for discharge to home pending PT/OT evaluation.  - Continue atorvastatin 80mg  and aspirin 325mg  qd - PT/OT evaluation - Speech path evaluation  Osteoporosis: History of osteoporosis for which she takes vitamin D3 daily and also receives infusions via PCP.  - Continue cholecalciferol 1000U qd   Dementia:  Patient with history of mild dementia on Aricept 10mg  daily. Patient is alert and oriented to self, place, time and situation.  - Continue aricept 10mg  qd   Dispo: Anticipated discharge in approximately 0-1 day(s).   Christine Heck, MD  Internal Medicine, PGY-1  12/29/2018, 7:28 AM Pager: [phone]

## 2018-12-29 NOTE — Progress Notes (Signed)
STROKE TEAM PROGRESS NOTE   INTERVAL HISTORY Her daughter is at the bedside.  She is up in the chair at the bedside.  She still has some dysarthria.  I personally reviewed history of presenting illness with the patient, daughter, electronic medical records as well as imaging films in PACS  Vitals:   12/28/18 2029 12/29/18 0040 12/29/18 0410 12/29/18 0801  BP: 136/73 130/77 112/73 129/79  Pulse: 68 66 75 61  Resp: 18 16 15 15   Temp: 98.6 F (37 C) 98.8 F (37.1 C) 99 F (37.2 C) 98.6 F (37 C)  TempSrc: Oral Oral Oral Oral  SpO2: 95% 96% 95% 95%  Weight: 42.6 kg     Height: 5\' 3"  (1.6 m)       CBC:  Recent Labs  Lab 12/28/18 0821 12/28/18 0827 12/29/18 0333  WBC 7.5  --  10.3  NEUTROABS 4.9  --   --   HGB 14.6 15.3* 14.2  HCT 44.9 45.0 41.7  MCV 105.2*  --  100.0  PLT 263  --  280    Basic Metabolic Panel:  Recent Labs  Lab 12/28/18 0821 12/28/18 0827 12/29/18 0333  NA 136 137 136  K 4.2 4.1 4.4  CL 104 101 106  CO2 22  --  22  GLUCOSE 133* 124* 87  BUN 26* 29* 21  CREATININE 1.05* 1.00 1.05*  CALCIUM 9.3  --  9.1   Lipid Panel:     Component Value Date/Time   CHOL 175 12/29/2018 0333   TRIG 52 12/29/2018 0333   HDL 70 12/29/2018 0333   CHOLHDL 2.5 12/29/2018 0333   VLDL 10 12/29/2018 0333   LDLCALC 95 12/29/2018 0333   HgbA1c:  Lab Results  Component Value Date   HGBA1C 5.5 12/29/2018    IMAGING Ct Angio Head W Or Wo Contrast  Result Date: 12/28/2018 CLINICAL DATA:  Left-sided weakness, slurred speech EXAM: CT ANGIOGRAPHY HEAD AND NECK TECHNIQUE: Multidetector CT imaging of the head and neck was performed using the standard protocol during bolus administration of intravenous contrast. Multiplanar CT image reconstructions and MIPs were obtained to evaluate the vascular anatomy. Carotid stenosis measurements (when applicable) are obtained utilizing NASCET criteria, using the distal internal carotid diameter as the denominator. CONTRAST:  13/01/2019  OMNIPAQUE IOHEXOL 350 MG/ML SOLN COMPARISON:  None. FINDINGS: CTA NECK FINDINGS Aortic arch: Great vessel origins are patent. Right carotid system: Common, internal, and external carotid arteries are patent. There is no measurable stenosis at the right ICA origin. No evidence of dissection. Left carotid system: Common, internal, and external carotid arteries are patent. No measurable stenosis at the left ICA origin. No evidence of dissection. Vertebral arteries: Patent. Left vertebral artery is dominant. No significant stenosis or evidence of dissection. Skeleton: Degenerative changes of the cervical spine primarily at C4-C5 and C5-C6. Other neck: No neck mass or adenopathy. Upper chest: No apical lung mass.  Mild centrilobular emphysema. Review of the MIP images confirms the above findings CTA HEAD FINDINGS Anterior circulation: Intracranial internal carotid arteries are patent. Anterior and middle cerebral arteries are patent. Right A1 ACA is dominant. Posterior circulation: Intracranial vertebral arteries, basilar artery, and posterior cerebral arteries are patent. Large posterior communicating arteries are present with fetal or near fetal origin of the left PCA. Venous sinuses: Patent as permitted by contrast timing. Anatomic variants: None. Review of the MIP images confirms the above findings IMPRESSION: No large vessel occlusion or hemodynamically significant stenosis. Electronically Signed   By: 13/12/2018  M.D.   On: 12/28/2018 13:37   Ct Angio Neck W And/or Wo Contrast  Result Date: 12/28/2018 CLINICAL DATA:  Left-sided weakness, slurred speech EXAM: CT ANGIOGRAPHY HEAD AND NECK TECHNIQUE: Multidetector CT imaging of the head and neck was performed using the standard protocol during bolus administration of intravenous contrast. Multiplanar CT image reconstructions and MIPs were obtained to evaluate the vascular anatomy. Carotid stenosis measurements (when applicable) are obtained utilizing NASCET  criteria, using the distal internal carotid diameter as the denominator. CONTRAST:  OMNIPAQUE IOHEXOL 350 MG/ML SOLN COMPARISON:  None. FINDINGS: CTA NECK FINDINGS Aortic arch: Great vessel origins are patent. Right carotid system: Common, internal, and external carotid arteries are patent. There is no measurable stenosis at the right ICA origin. No evidence of dissection. Left carotid system: Common, internal, and external carotid arteries are patent. No measurable stenosis at the left ICA origin. No evidence of dissection. Vertebral arteries: Patent. Left vertebral artery is dominant. No significant stenosis or evidence of dissection. Skeleton: Degenerative changes of the cervical spine primarily at C4-C5 and C5-C6. Other neck: No neck mass or adenopathy. Upper chest: No apical lung mass.  Mild centrilobular emphysema. Review of the MIP images confirms the above findings CTA HEAD FINDINGS Anterior circulation: Intracranial internal carotid arteries are patent. Anterior and middle cerebral arteries are patent. Right A1 ACA is dominant. Posterior circulation: Intracranial vertebral arteries, basilar artery, and posterior cerebral arteries are patent. Large posterior communicating arteries are present with fetal or near fetal origin of the left PCA. Venous sinuses: Patent as permitted by contrast timing. Anatomic variants: None. Review of the MIP images confirms the above findings IMPRESSION: No large vessel occlusion or hemodynamically significant stenosis. Electronically Signed   By: Guadlupe Spanish M.D.   On: 12/28/2018 13:37   Mr Brain Wo Contrast  Result Date: 12/28/2018 CLINICAL DATA:  Slurred speech left facial droop EXAM: MRI HEAD WITHOUT CONTRAST TECHNIQUE: Multiplanar, multiecho pulse sequences of the brain and surrounding structures were obtained without intravenous contrast. COMPARISON:  CT head 12/28/2018 FINDINGS: Brain: Small acute infarct in the corona radiata on the right. Extensive  atrophy. Moderate chronic microvascular ischemic change in the white matter. Chronic infarcts in the thalamus bilaterally. Small chronic infarcts in the cerebellum bilaterally. Negative for mass or edema. Several areas of chronic microhemorrhage in the brain including the thalamus bilaterally, right posterior temporal/occipital lobe, and left cerebellum. No fluid collection or midline shift. Vascular: Normal arterial flow voids Skull and upper cervical spine: Bifrontal craniotomy over the convexity Sinuses/Orbits: Mild mucosal edema paranasal sinuses.  Normal orbit Other: None IMPRESSION: 1 cm acute infarct right corona radiata Extensive atrophy. Moderate chronic microvascular ischemic change. Scattered areas of chronic microhemorrhage likely due to hypertension. Electronically Signed   By: Marlan Palau M.D.   On: 12/28/2018 15:30   Ct Head Code Stroke Wo Contrast  Addendum Date: 12/28/2018   ADDENDUM REPORT: 12/28/2018 08:47 ADDENDUM: Results texted to Dr. Laurence Slate at the time of interpretation. Electronically Signed   By: Marlan Palau M.D.   On: 12/28/2018 08:47   Result Date: 12/28/2018 CLINICAL DATA:  Code stroke. Focal neuro deficit. Left-sided weakness slurred speech EXAM: CT HEAD WITHOUT CONTRAST TECHNIQUE: Contiguous axial images were obtained from the base of the skull through the vertex without intravenous contrast. COMPARISON:  CT head 01/21/2015 FINDINGS: Brain: Moderate atrophy. Patchy white matter hypodensity bilaterally has progressed and appears chronic. Negative for acute infarct, hemorrhage, or mass lesion. Vascular: Negative for hyperdense vessel Skull: Bifrontal craniotomy.  No acute  skeletal abnormality. Sinuses/Orbits: Air-fluid level left sphenoid sinus. Remaining sinuses clear. No orbital lesion. Other: None ASPECTS ([place] Stroke Program Early CT Score) - Ganglionic level infarction (caudate, lentiform nuclei, internal capsule, insula, M1-M3 cortex): 7 - Supraganglionic infarction  (M4-M6 cortex): 3 Total score (0-10 with 10 being normal): 10 IMPRESSION: 1. No acute intracranial abnormality 2. ASPECTS is 10 3. Atrophy and chronic microvascular ischemia. Electronically Signed: By: Franchot Gallo M.D. On: 12/28/2018 08:38    PHYSICAL EXAM Pleasant frail petite elderly Caucasian lady not in distress. . Afebrile. Head is nontraumatic. Neck is supple without bruit.    Cardiac exam no murmur or gallop. Lungs are clear to auscultation. Distal pulses are well felt. Neurological Exam ;  Awake  Alert oriented x 3.  Slightly dysarthric speech speech but no aphasia.[place] movements full without nystagmus.fundi were not visualized. Vision acuity and fields appear normal. Hearing is normal. Palatal movements are normal. Face asymmetric with mild left lower facial weakness. Tongue midline. Normal strength, tone, reflexes and coordination. Normal sensation. Gait deferred. NIHSS 2 ASSESSMENT/PLAN Ms. Christine Mccullough is a 84 y.o. female with history of dementia and RA presenting with slurred speech and L facial droop.   Stroke:   R corona radiata infarct secondary to small vessel disease source  Code Stroke CT head No acute abnormality. Small vessel disease. Atrophy. ASPECTS 10.     CTA head & neck Unremarkable   MRI  1cm R corona radiata infarct. Extensive atophy. Scattered chronic microhemorrhage d/t HTN  2D Echo EF 65-70%. No source of embolus   LDL 95  HgbA1c 5.5  Heparin 5000 units sq tid for VTE prophylaxis  No antithrombotic prior to admission, now on aspirin 325 mg daily. Given mild stroke, recommend aspirin 81 mg and plavix 75 mg daily x 3 weeks, then aspirin alone. Orders adjusted.   Therapy recommendations:  HH PT, OT pending   Disposition:  Return home. Family will look after. They all live close.  Hypertensive Emergency  SBP > 240 on admission   Home meds:  No home meds  stable . Permissive hypertension (OK if < 220/120) but gradually normalize in 5-7  days . Long-term BP goal normotensive  Hyperlipidemia  Home meds:  No statin  Now on lipitor 80  Decrease lipitor to 37 given advanced age   LDL 95, goal < 70  Continue statin at discharge  Other Stroke Risk Factors  Advanced age  Smoker, advised to stop d/t stroke risk  Other Active Problems  Baseline dementia  RA on methotrexate  Osteoporosis on cholecalciferol  Hospital day # 0  I have personally obtained history,examined this patient, reviewed notes, independently viewed imaging studies, participated in medical decision making and plan of care.ROS completed by me personally and pertinent positives fully documented  I have made any additions or clarifications directly to the above note.  She presented with dysarthria and left facial droop secondary to small right subcortical lacunar infarct from small vessel disease.  Recommend aspirin Plavix for 3 weeks followed by aspirin alone and aggressive risk factor modification.  Statin for elevated lipids.  Check echocardiogram.  Long discussion with patient and daughter and answered questions.  Greater than 50% time during this 35-minute visit was spent on counseling and coordination of care about her lacunar stroke and discussion about stroke prevention and treatment and answering questions.  Antony Contras, MD Medical Director [facility] Pager: [phone] 12/29/2018 2:12 PM   To contact Stroke Continuity provider, please refer to [email or web address]  After hours, contact General Neurology

## 2018-12-29 NOTE — Evaluation (Signed)
Occupational Therapy Evaluation Patient Details Name: Christine Mccullough MRN: [id number] DOB: Sep 28, 1934 Today's Date: 12/29/2018    History of Present Illness 84 yo admitted from home with left facial droop and slurred speech with Rt corona radiata infarct. PMhx: RA, dementia, osteoporosis   Clinical Impression   Pt admitted with above diagnoses, presenting with cognitive deficits and decreased strength and ability to complete BADLs at desired level of independence. Dtr present for session and prior history. PTA pt living alone with aide coming in 3x per week to assist with IADLs. At time of eval pt is min guard-min A for functional transfers and functional mobility with RW. She is very resistant to using DME and has poor safety awareness. Educated dtr and pt on home safety and reducing risk of falls. Pt needs 24/7 supervision for safety, if this cannot be provided at home SNF level of care will be needed. Will continue to follow per POC listed below.    Follow Up Recommendations  Supervision/Assistance - 24 hour;Home health OT;Other (comment)(if 24/7 cannot be home, rec SNF)    Equipment Recommendations  None recommended by OT    Recommendations for Other Services       Precautions / Restrictions Precautions Precautions: Fall Restrictions Weight Bearing Restrictions: No      Mobility Bed Mobility               General bed mobility comments: up in recliner  Transfers Overall transfer level: Needs assistance   Transfers: Sit to/from Stand Sit to Stand: Min guard;Min assist         General transfer comment: min guard-min A for safety and to correct posterior lean/LOB    Balance Overall balance assessment: Needs assistance Sitting-balance support: No upper extremity supported Sitting balance-Leahy Scale: Good     Standing balance support: Single extremity supported Standing balance-Leahy Scale: Poor Standing balance comment: reliant on external support                           ADL either performed or assessed with clinical judgement   ADL Overall ADL's : Needs assistance/impaired Eating/Feeding: Set up;Sitting   Grooming: Set up;Sitting;Cueing for safety   Upper Body Bathing: Set up;Sitting;Cueing for safety   Lower Body Bathing: Minimal assistance;Sit to/from stand;Sitting/lateral leans;Cueing for safety   Upper Body Dressing : Set up;Sitting;Cueing for safety   Lower Body Dressing: Minimal assistance;Sit to/from stand;Sitting/lateral leans;Cueing for safety   Toilet Transfer: Minimal assistance;Min guard;Cueing for safety;Ambulation;RW   Toileting- Clothing Manipulation and Hygiene: Min guard;Cueing for Careers information officer: Minimal assistance;Shower seat;Ambulation;Rolling walker;Cueing for safety   Functional mobility during ADLs: Min guard;Minimal assistance;Cueing for safety;Rolling walker General ADL Comments: pt limited by cognitive deficits and generalized weakness     Vision Patient Visual Report: No change from baseline       Perception     Praxis      Pertinent Vitals/Pain Pain Assessment: No/denies pain     Hand Dominance Right   Extremity/Trunk Assessment Upper Extremity Assessment Upper Extremity Assessment: Generalized weakness   Lower Extremity Assessment Lower Extremity Assessment: Defer to PT evaluation       Communication Communication Communication: Expressive difficulties   Cognition Arousal/Alertness: Awake/alert Behavior During Therapy: WFL for tasks assessed/performed Overall Cognitive Status: Impaired/Different from baseline Area of Impairment: Safety/judgement;Problem solving                         Safety/Judgement: Decreased  awareness of deficits;Decreased awareness of safety   Problem Solving: Slow processing General Comments: needing consistent safety cues and supervision. Very impulsive, attempting to get out of recliner unsafely. Going to sit on objects  before they are there. Reaching out of BOS unsafely for items   General Comments       Exercises     Shoulder Instructions      Home Living Family/patient expects to be discharged to:: Private residence Living Arrangements: Alone;Other (Comment)(dtr going to stay) Available Help at Discharge: Personal care attendant;Family;Friend(s) Type of Home: House Home Access: Stairs to enter [employer] of Steps: 3   Home Layout: One level     Bathroom Shower/Tub: Occupational psychologist: Standard     Home Equipment: Environmental consultant - 2 wheels;Walker - 4 wheels          Prior Functioning/Environment Level of Independence: Needs assistance  Gait / Transfers Assistance Needed: walks without assist ADL's / Homemaking Assistance Needed: has aide 3 days per week that helps with cooking and home maintinaing. Does not drive. able to bathe and dress self            OT Problem List: Decreased strength;Decreased knowledge of use of DME or AE;Decreased activity tolerance;Decreased cognition;Impaired balance (sitting and/or standing);Decreased coordination      OT Treatment/Interventions: Self-care/ADL training;Therapeutic exercise;Patient/family education;Balance training;Energy conservation;Therapeutic activities;DME and/or AE instruction;Cognitive remediation/compensation    OT Goals(Current goals can be found in the care plan section) Acute Rehab OT Goals Patient Stated Goal: return home OT Goal Formulation: With patient Time For Goal Achievement: 01/12/19 Potential to Achieve Goals: Good  OT Frequency: Min 2X/week   Barriers to D/C:            Co-evaluation              AM-PAC OT "6 Clicks" Daily Activity     Outcome Measure Help from another person eating meals?: A Little Help from another person taking care of personal grooming?: A Little Help from another person toileting, which includes using toliet, bedpan, or urinal?: A Little Help from another  person bathing (including washing, rinsing, drying)?: A Little Help from another person to put on and taking off regular upper body clothing?: A Little Help from another person to put on and taking off regular lower body clothing?: A Little 6 Click Score: 18   End of Session Equipment Utilized During Treatment: Gait belt;Rolling walker Nurse Communication: Mobility status  Activity Tolerance: Patient tolerated treatment well Patient left: in chair;with call bell/phone within reach;with chair alarm set;with family/visitor present  OT Visit Diagnosis: Unsteadiness on feet (R26.81);Other abnormalities of gait and mobility (R26.89);Muscle weakness (generalized) (M62.81);Other symptoms and signs involving cognitive function                Time: 5361-4431 OT Time Calculation (min): 15 min Charges:  OT General Charges $OT Visit: 1 Visit OT Evaluation $OT Eval Moderate Complexity: 1 Mod  Zenovia Jarred, MSOT, OTR/L Behavioral Health OT/ Acute Relief OT [facility] Office: [phone]  Zenovia Jarred 12/29/2018, 3:53 PM

## 2018-12-29 NOTE — Plan of Care (Signed)
Patient stable, discussed POC with patient and daughter, agreeable with plan, denies question/concerns at this time.  

## 2018-12-29 NOTE — Evaluation (Signed)
Physical Therapy Evaluation Patient Details Name: Christine Mccullough MRN: [id number] DOB: [birth year] Today's Date: 12/29/2018   History of Present Illness  84 yo admitted from home with left facial droop and slurred speech with Rt corona radiata infarct. PMhx: RA, dementia, osteoporosis  Clinical Impression  Pt pleasant and eager to get OOB. Found with gown twisted around her and pt unaware. Pt oriented to time and situation with decreased problem solving, awareness of deficits and balance. Pt sitting prior to reaching surface, hitting obstacles on left and despite cues for errors pt unable to recognize safety concerns or need for assist. Pt will benefit from acute therapy to maximize mobility, function, gait and independence to decrease burden of care. Pt with slurred speech and difficulty controlling secretions but eager to eat. RN aware of all above.    Follow Up Recommendations Home health PT;Supervision/Assistance - 24 hour;SNF(is no 24hr assist SNF recommended)    Equipment Recommendations  Other (comment)(rail installed for stairs)    Recommendations for Other Services OT consult     Precautions / Restrictions Precautions Precautions: Fall      Mobility  Bed Mobility Overal bed mobility: Needs Assistance Bed Mobility: Supine to Sit     Supine to sit: Supervision     General bed mobility comments: supervision for safety  Transfers Overall transfer level: Needs assistance   Transfers: Sit to/from Stand Sit to Stand: Min guard         General transfer comment: guarding to stand from bed with initial posterior lean, guarding to achieve sitting on toilet as pt sitting prior to reaching surface and use of rail to rise from toilet  Ambulation/Gait Ambulation/Gait assistance: Min assist;Min guard Gait Distance (Feet): 250 Feet Assistive device: Rolling walker (2 wheeled);None Gait Pattern/deviations: Step-through pattern;Decreased stride length;Trunk flexed   Gait  velocity interpretation: >2.62 ft/sec, indicative of community ambulatory General Gait Details: pt initially starting gait without UE support with unsteady gait and min assist to balance with pt reaching for environmental support with narrow BOs and cautious steps. After 10' utilized RW with significantly improved balance with cues for proximity to RW and posture. Mod cues for direction as pt running into obstacles to left repeatedly  Stairs Stairs: Yes Stairs assistance: Supervision Stair Management: One rail Right;Alternating pattern;Forwards Number of Stairs: 3 General stair comments: reliance on rail which pt reports she does not have at home  Wheelchair Mobility    Modified Rankin (Stroke Patients Only) Modified Rankin (Stroke Patients Only) Pre-Morbid Rankin Score: Moderate disability Modified Rankin: Moderately severe disability     Balance Overall balance assessment: Needs assistance Sitting-balance support: No upper extremity supported Sitting balance-Leahy Scale: Good     Standing balance support: Single extremity supported Standing balance-Leahy Scale: Fair Standing balance comment: can wash hands without support, RW for gait                             Pertinent Vitals/Pain Pain Assessment: No/denies pain    Home Living Family/patient expects to be discharged to:: Private residence   Available Help at Discharge: Personal care attendant;Family;Friend(s) Type of Home: House Home Access: Stairs to enter   [employer] of Steps: 3 Home Layout: One level Home Equipment: Walker - 2 wheels      Prior Function Level of Independence: Needs assistance   Gait / Transfers Assistance Needed: walks without assist, no falls  ADL's / Homemaking Assistance Needed: cooking and cleaning per aide 3 days /week.  Pt does simple meals and performs own bathing and dressing. Does not drive  Comments: likes to go to the beach and have coffee     Hand  Dominance   Dominant Hand: Right    Extremity/Trunk Assessment   Upper Extremity Assessment Upper Extremity Assessment: Defer to OT evaluation    Lower Extremity Assessment Lower Extremity Assessment: Overall WFL for tasks assessed(4/5 bil hip flexion, 5/5 bil knee flexion and extension with intact sensation)    Cervical / Trunk Assessment Cervical / Trunk Assessment: Kyphotic  Communication   Communication: Expressive difficulties  Cognition Arousal/Alertness: Awake/alert Behavior During Therapy: WFL for tasks assessed/performed Overall Cognitive Status: Impaired/Different from baseline Area of Impairment: Safety/judgement;Problem solving                         Safety/Judgement: Decreased awareness of deficits;Decreased awareness of safety   Problem Solving: Slow processing General Comments: pt educated for safety deficits and need for assist but still insisting she is safe alone without RW. Pt sitting at toilet and chair prior to fully reaching surface, running into objects on left despite cues      General Comments      Exercises     Assessment/Plan    PT Assessment Patient needs continued PT services  PT Problem List Decreased mobility;Decreased activity tolerance;Decreased cognition;Decreased balance;Decreased knowledge of use of DME       PT Treatment Interventions Gait training;Balance training;DME instruction;Therapeutic exercise;Stair training;Functional mobility training;Cognitive remediation;Therapeutic activities;Patient/family education    PT Goals (Current goals can be found in the Care Plan section)  Acute Rehab PT Goals Patient Stated Goal: return home PT Goal Formulation: With patient Time For Goal Achievement: 01/12/19 Potential to Achieve Goals: Fair    Frequency Min 4X/week   Barriers to discharge Decreased caregiver support pt lives alone, daughter works unsure of 24 hr assist    Co-evaluation               AM-PAC PT "6  Clicks" Mobility  Outcome Measure Help needed turning from your back to your side while in a flat bed without using bedrails?: A Little Help needed moving from lying on your back to sitting on the side of a flat bed without using bedrails?: A Little Help needed moving to and from a bed to a chair (including a wheelchair)?: A Little Help needed standing up from a chair using your arms (e.g., wheelchair or bedside chair)?: A Little Help needed to walk in hospital room?: A Little Help needed climbing 3-5 steps with a railing? : A Little 6 Click Score: 18    End of Session Equipment Utilized During Treatment: Gait belt Activity Tolerance: Patient tolerated treatment well Patient left: in chair;with call bell/phone within reach;with chair alarm set Nurse Communication: Mobility status PT Visit Diagnosis: Other abnormalities of gait and mobility (R26.89);Unsteadiness on feet (R26.81);Other symptoms and signs involving the nervous system (R29.898)    Time: 2585-2778 PT Time Calculation (min) (ACUTE ONLY): 27 min   Charges:   PT Evaluation $PT Eval Moderate Complexity: 1 Mod PT Treatments $Gait Training: 8-22 mins        Viyaan Champine P, PT Acute Rehabilitation Services Pager: [phone] Office: [place] 12/29/2018, 11:36 AM

## 2018-12-29 NOTE — Discharge Summary (Addendum)
Name: Christine Mccullough MRN: [id number] DOB: [birth year] 84 y.o. PCP: Adin Hector, MD  Date of Admission: 12/28/2018  8:20 AM Date of Discharge:  12/29/2018 Attending Physician: Axel Filler, *  Discharge Diagnosis: 1. Acute right corona radiata infarction 2. Osteoporosis 3. Dementia  4. Rheumatoid arthritis  Discharge Medications: Allergies as of 12/29/2018       Reactions   Codeine Other (See Comments)   agitation   Propoxyphene Nausea And Vomiting   Pneumococcal Polysaccharide Vaccine Rash        Medication List     TAKE these medications    aspirin 81 MG EC tablet Take 1 tablet (81 mg total) by mouth daily. Start taking on: December 30, 2018   atorvastatin 80 MG tablet Commonly known as: LIPITOR Take 1 tablet (80 mg total) by mouth daily at 6 PM.   clopidogrel 75 MG tablet Commonly known as: PLAVIX Take 1 tablet (75 mg total) by mouth daily for three weeks   donepezil 10 MG tablet Commonly known as: ARICEPT Take 10 mg by mouth daily.   folic acid 1 MG tablet Commonly known as: FOLVITE Take 1 mg by mouth daily.   ibuprofen 200 MG tablet Commonly known as: ADVIL Take 400-600 mg by mouth every 6 (six) hours as needed for fever, headache or mild pain.   methotrexate 2.5 MG tablet Take 15 mg by mouth every Sunday.   Vitamin D-1000 Max St 25 MCG (1000 UT) tablet Generic drug: Cholecalciferol Take 1,000 Units by mouth daily.        Disposition and follow-up:   Ms.Christine Mccullough was discharged from [facility] in Stable condition.  At the hospital follow up visit please address:  1.  Acute right corona radiata infarction: Patient presented with dysarthria and left facial droop in setting of acute right corona radiata infarction. During her stay, she was started on DAPT for 3 weeks and atorvastatin.  At follow up visit, please assess for improvement in facial droop and dysarthria. Please make sure she is taking her aspirin,  plavix and atorvastatin as prescribed.  - Plavix and aspirin combination for 3 weeks, after which she can discontinue plavix and take aspirin alone - Atorvastatin 80mg  daily - Modify risk factors including smoking cessation  2.  Labs / imaging needed at time of follow-up: none  3.  Pending labs/ test needing follow-up: none   Follow-up Appointments: Follow-up Information     Tama High III, MD. Schedule an appointment as soon as possible for a visit in 1 week(s).   Specialty: Internal Medicine Contact information: [place] [address] [place] Hospital Course by problem list: 1. Acute right corona radiata infarction:  Patient with moderate vascular dementia admitted with acute stroke at right corona radiata causing dysarthria and left facial droop. CT Head without acute intracranial abnormality but suggestive of atrophy and chronic microvascular ischemia. CT Angio Head and Neck negative for occlusion of large vessels. MRI brain significant for right corona radiata infarction with scattered areas of chronic microhemorrhages. Stroke work up negative for any structural abnormalities noted on Echo. Patient does not have hypertension and her HbA1c 5.5%. Her largest risk factor is current tobacco use for which she was counseled. However, she is not interested in quitting smoking at this time. Daughter, at bedside, will continue to work with patient on this.   2. Osteoporosis: Patient with  history of osteoporosis on vitamin D 1000U daily.   3. Dementia:  Patient with history of vascular dementia. She lives independently in [place] but her daughter manages her finances. Per daughter, the daughter and son are power of attorney for her. She is able to perform all ADLs on her own but does not drive. Will continue Aricept 10mg  daily.   4. Rheumatoid arthritis: Patient with history of rheumatoid arthritis on methotrexate 2.5mg   every day and ibuprofen 400-600mg  q6h prn.   Discharge Vitals:   BP 127/79 (BP Location: Left Arm)   Pulse 64   Temp 99.3 F (37.4 C) (Oral)   Resp 17   Ht 5\' 3"  (1.6 m)   Wt 42.6 kg   SpO2 99%   BMI 16.64 kg/m   Pertinent Labs, Studies, and Procedures:  CBC Latest Ref Rng & Units 12/29/2018 12/28/2018 12/28/2018  WBC 4.0 - 10.5 K/uL 10.3 - 7.5  Hemoglobin 12.0 - 15.0 g/dL 13/12/2018 15.3(H) 14.6  Hematocrit 36.0 - 46.0 % 41.7 45.0 44.9  Platelets 150 - 400 K/uL 280 - 263   BMP Latest Ref Rng & Units 12/29/2018 12/28/2018 12/28/2018  Glucose 70 - 99 mg/dL 87 13/12/2018) 13/12/2018)  BUN 8 - 23 mg/dL 21 474(Q) 595(G)  Creatinine 0.44 - 1.00 mg/dL 38(V) 56(E 3.32(R)  Sodium 135 - 145 mmol/L 136 137 136  Potassium 3.5 - 5.1 mmol/L 4.4 4.1 4.2  Chloride 98 - 111 mmol/L 106 101 104  CO2 22 - 32 mmol/L 22 - 22  Calcium 8.9 - 10.3 mg/dL 9.1 - 9.3   CT HEAD WO CONTRAST 11/11: IMPRESSION: 1. No acute intracranial abnormality 2. ASPECTS is 10 3. Atrophy and chronic microvascular ischemia.  CT ANGIO HEAD AND NECK 11/11: IMPRESSION: No large vessel occlusion or hemodynamically significant stenosis.  MR BRAIN WO CONTRAST 11/11: IMPRESSION: 1 cm acute infarct right corona radiata Extensive atrophy. Moderate chronic microvascular ischemic change. Scattered areas of chronic microhemorrhage likely due to hypertension.  Discharge Instructions: Discharge Instructions     Call MD for:  difficulty breathing, headache or visual disturbances   Complete by: As directed    Call MD for:  extreme fatigue   Complete by: As directed    Call MD for:  persistant dizziness or light-headedness   Complete by: As directed    Call MD for:  persistant nausea and vomiting   Complete by: As directed    Call MD for:  temperature >100.4   Complete by: As directed    Diet - low sodium heart healthy   Complete by: As directed    Increase activity slowly   Complete by: As directed       Ms. Christine, Mccullough were  admitted to the hospital with an acute stroke. You are being discharged with home health physical therapy and speech therapy.   On discharge, Please start the following medications: Atorvastatin 80mg  daily  Aspirin 81 mg daily Clopidogrel 75mg  daily (for 3 weeks only)  Please schedule an appointment with your PCP within 1-2 weeks of discharge.  Thank you!   Signed: 13/11, MD  Internal Medicine, PGY-1  12/29/2018, 3:04 PM   Pager: [phone]

## 2019-01-25 ENCOUNTER — Other Ambulatory Visit: Payer: Self-pay | Admitting: Internal Medicine

## 2019-01-25 DIAGNOSIS — R1310 Dysphagia, unspecified: Secondary | ICD-10-CM

## 2019-01-25 DIAGNOSIS — R633 Feeding difficulties, unspecified: Secondary | ICD-10-CM

## 2019-01-27 ENCOUNTER — Other Ambulatory Visit: Payer: Self-pay | Admitting: Internal Medicine

## 2019-01-27 DIAGNOSIS — R1310 Dysphagia, unspecified: Secondary | ICD-10-CM

## 2019-01-27 DIAGNOSIS — I639 Cerebral infarction, unspecified: Secondary | ICD-10-CM

## 2019-01-31 ENCOUNTER — Other Ambulatory Visit: Payer: Self-pay

## 2019-01-31 ENCOUNTER — Ambulatory Visit
Admission: RE | Admit: 2019-01-31 | Discharge: 2019-01-31 | Disposition: A | Discharge status: Home / Self Care | Payer: Medicare Other | Source: Ambulatory Visit | Attending: Internal Medicine | Admitting: Internal Medicine

## 2019-01-31 DIAGNOSIS — I639 Cerebral infarction, unspecified: Secondary | ICD-10-CM | POA: Insufficient documentation

## 2019-01-31 DIAGNOSIS — R1313 Dysphagia, pharyngeal phase: Secondary | ICD-10-CM | POA: Insufficient documentation

## 2019-01-31 DIAGNOSIS — R1310 Dysphagia, unspecified: Secondary | ICD-10-CM

## 2019-01-31 NOTE — Therapy (Addendum)
[place], [place], [address] Phone: [phone]   Fax:     Modified Barium Swallow  Patient Details  Name: Christine Mccullough MRN: [id number] Date of Birth: 06-Oct-1934 No data recorded  Encounter Date: 01/31/2019  End of Session - 01/31/19 1644    Visit Number  1    Number of Visits  1    Date for SLP Re-Evaluation  01/31/19    SLP Start Time  1245    SLP Stop Time   1400    SLP Time Calculation (min)  75 min    Activity Tolerance  Patient tolerated treatment well       Past Medical History:  Diagnosis Date  . Arthritis   . Dementia ([place])      There were no vitals filed for this visit.      Subjective: Patient behavior: (alertness, ability to follow instructions, etc.): CVA in Nov 2020 - "corona radiata" per Dtr present w/ pt. Noted slight decreased articulation precision of speech but 100% intelligible during conversation. Pt has a baseline of Dementia - confusion of events/time was noted during interview. Difficulty completing OM exam. Dentition+.  Chief complaint: dysphagia   Objective:  Radiological Procedure: A videoflouroscopic evaluation of oral-preparatory, reflex initiation, and pharyngeal phases of the swallow was performed; as well as a screening of the upper esophageal phase.  I. POSTURE: upright II. VIEW: lateral III. COMPENSATORY STRATEGIES: time for f/u, Dry swallows; small, single sips monitored/encouarged - No straws IV. BOLUSES ADMINISTERED:  Thin Liquid: 4-5 trials, use of Cup  Nectar-thick Liquid: 3 trials, use of Cup  Honey-thick Liquid: NT  Puree: 4 trials  Mechanical Soft: 2 trials V. RESULTS OF EVALUATION: A. ORAL PREPARATORY PHASE: (The lips, tongue, and velum are observed for strength and coordination)       **Overall Severity Rating: Mild. Pt exhibited min decreased bolus control and organization; premature spillage of trials noted - moreso w/ liquid boluses.  Oral clearing achieved post trials given time.   B. SWALLOW INITIATION/REFLEX: (The reflex is normal if "triggered" by the time the bolus reached the base of the tongue)  **Overall Severity Rating: Mild-Mod. Pt exhibited a delay in pharyngeal swallow initiation w/ trials spilling from the Valleculae b/f pharyngeal swallow initiation engaged; this resulted in reduced timing, and tight, airway closure. Laryngeal Penetration was noted w/ trials of thin liquids; Nectar liquids. NO Aspiration was noted during this study.   C. PHARYNGEAL PHASE: (Pharyngeal function is normal if the bolus shows rapid, smooth, and continuous transit through the pharynx and there is no pharyngeal residue after the swallow)  **Overall Severity Rating: [facility]. No significant bolus residue remained post completion of the pharyngeal swallow. This indicates adequate laryngeal excursion and pharyngeal pressure during the swallow.    D. LARYNGEAL PENETRATION: (Material entering into the laryngeal inlet/vestibule but not aspirated): x4 w/ thin liquids; x2 w/ Nectar consistency liquids -- consistently noted w/ all liquid trials despite consistency E. ASPIRATION: NONE F. ESOPHAGEAL PHASE: (Screening of the upper esophagus): no overt dysmotility noted during this exam in the Cervical Esophagus   ASSESSMENT: Pt appears to present w/ Mild+ oropharyngeal phase dysphagia w/ laryngeal Penetration occurring during trials of liquids despite their consistency(thin or Nectar). NO Aspiration was noted during this exam; NO buildup of bolus material from the laryngeal Penetration was noted in the laryngeal vestibule as trials continued. Pt appeared to benefit from monitoring during drinking of liquids w/ verbal/tactile encouragement to take Small,  Single sips at a time via Cup. During the oral phase, Pt exhibited decreased bolus control and organization; premature spillage of trials noted - moreso w/ liquid boluses. Oral clearing achieved post trials  given time. During the pharyngeal phase, Pt exhibited a delay in pharyngeal swallow initiation w/ trials spilling from the Valleculae b/f pharyngeal swallow initiation engaged; this resulted in reduced timing, and tight, airway closure. Laryngeal Penetration was noted w/ trials of thin liquids; Nectar liquids. NO Aspiration was noted during this study. No significant bolus residue remained post completion of the pharyngeal swallow. This indicates adequate laryngeal excursion and pharyngeal pressure during the swallow. During the Esophageal phase, no overt dysmotility noted during this exam in the Cervical Esophagus.  Recommend continue w/ thin liquids in pt's diet at this time w/ monitoring of pt's Pulmonary status - watchful of any decline or negative sequelae of potential aspiration occurring. Recommend Home Health ST services f/u for ongoing education re: Cognitive decline and swallowing, and monitoring of toleration of diet; use of a Dysphagia drink cup if appropriate for support.    PLAN/RECOMMENDATIONS:  A. Diet: more of a Mech Soft consistency(pt already does); Thin liquids VIA CUP to better monitor sip size and give time to "organize" the swallow; Pills in Puree for safer swallowing  B. Swallowing Precautions: aspiration precautions to include No Straws; Cream Soups; reducing Distractions during meals including talking  C. Recommended consultation to: Dietician for any support needed  D. Therapy recommendations: pt is followed by Home Health ST services -- f/u w/ Education on aspiration precautions and monitoring behaviors during meals; food/liquid consistencies in diet for easier, safer intake  E. Results and recommendations were discussed w/ pt and Daughter present; video viewed; questions answered; recommendations discussed            Dysphagia, pharyngeal phase  Dysphagia, unspecified type - Plan: DG SWALLOW FUNC OP MEDICARE SPEECH PATH, DG SWALLOW FUNC OP MEDICARE SPEECH  PATH  Cerebral infarction, unspecified mechanism (HCC) - Plan: DG SWALLOW FUNC OP MEDICARE SPEECH PATH, DG SWALLOW FUNC OP MEDICARE SPEECH PATH        Problem List Patient Active Problem List   Diagnosis Date Noted  . Dysarthria 12/29/2018  . Tobacco use disorder 12/29/2018  . Vascular dementia (HCC) 12/29/2018  . Age-related osteoporosis without current pathological fracture 12/29/2018  . Acute ischemic stroke (HCC) 12/28/2018  . Mild protein-calorie malnutrition (HCC) 12/27/2017  . Vitamin D deficiency 12/20/2014  . Macular degeneration 05/24/2013  . Rheumatoid arthritis (HCC) 05/03/2013       Jerilynn Som, MS, CCC-SLP Leannah Guse 01/31/2019, 4:45 PM  [facility] DIAGNOSTIC RADIOLOGY [address] [place], [place], [address] Phone: [phone]   Fax:     Name: Christine Mccullough MRN: [id number] Date of Birth: Nov 16, 1934

## 2019-02-13 ENCOUNTER — Ambulatory Visit: Payer: Medicare Other

## 2019-05-03 ENCOUNTER — Ambulatory Visit: Payer: Medicare Other | Admitting: Occupational Therapy

## 2019-05-09 ENCOUNTER — Ambulatory Visit: Payer: Medicare Other | Admitting: Occupational Therapy

## 2019-05-09 ENCOUNTER — Ambulatory Visit: Payer: No Typology Code available for payment source | Attending: Rheumatology | Admitting: Occupational Therapy

## 2019-05-09 ENCOUNTER — Other Ambulatory Visit: Payer: Self-pay

## 2019-05-09 ENCOUNTER — Encounter: Payer: Self-pay | Admitting: Occupational Therapy

## 2019-05-09 DIAGNOSIS — M79641 Pain in right hand: Secondary | ICD-10-CM | POA: Diagnosis present

## 2019-05-09 DIAGNOSIS — M6281 Muscle weakness (generalized): Secondary | ICD-10-CM | POA: Diagnosis present

## 2019-05-09 NOTE — Therapy (Signed)
[place] [facility] PHYSICAL AND SPORTS MEDICINE [address] [place], [address] Phone: [phone]   Fax:  [phone]  Occupational Therapy Evaluation  Patient Details  Name: Christine Mccullough MRN: [id number] Date of Birth: 10/29/1934 Referring Provider (OT): Gavin Potters   Encounter Date: 05/09/2019  OT End of Session - 05/09/19 1606    Visit Number  1    Number of Visits  4    Date for OT Re-Evaluation  07/04/19    OT Start Time  1500    OT Stop Time  1554    OT Time Calculation (min)  54 min    Activity Tolerance  Patient tolerated treatment well    Behavior During Therapy  [facility] for tasks assessed/performed       Past Medical History:  Diagnosis Date  . Arthritis   . Dementia (HCC)     History reviewed. No pertinent surgical history.  There were no vitals filed for this visit.  Subjective Assessment - 05/09/19 1557    Subjective   My middle finger hurts - Dr Gavin Potters gave me shot end Febr for index - but then 2 wks ago my middle finger was swollen , red and painfull - the prednisone I finished and feels better but stil hurts - most all the time and when making fist    Pertinent History  Pt had CVA last Nov - affecting the L side of body , then she fell in January 2021 , then Dr Gavin Potters gave her shot for 2nd R digit 2/25 and then 3/09 had increase pain , tenderness , swelling in 3rd digit- refer to OT/hand therapy    Patient Stated Goals  Want to pain better in my R hand    Currently in Pain?  Yes    Pain Score  4     Pain Location  Hand    Pain Orientation  Right    Pain Descriptors / Indicators  Aching;Tender    Pain Type  Chronic pain    Pain Onset  More than a month ago    Pain Frequency  Intermittent        [facility] OT Assessment - 05/09/19 0001      Assessment   Medical Diagnosis  RA with tenosynovitis digits     Referring Provider (OT)  Gavin Potters    Onset Date/Surgical Date  04/25/19    Hand Dominance  Right      Balance  Screen   Has the patient fallen in the past 6 months  Yes    How many times?  1    Has the patient had a decrease in activity level because of a fear of falling?   Yes      Home  Environment   Lives With  Alone   with caregivers and daughter in daytime      Prior Function   Vocation  Retired    Leisure  read, Scientist, physiological,      Strength   Right Hand Grip (lbs)  19    Right Hand Lateral Pinch  10 lbs    Right Hand 3 Point Pinch  8 lbs    Left Hand Grip (lbs)  13    Left Hand Lateral Pinch  9 lbs    Left Hand 3 Point Pinch  6 lbs      Right Hand AROM   R Thumb Opposition to Index  --   WNL   R Index  MCP 0-90  75 Degrees    R Index PIP 0-100  100 Degrees    R Long  MCP 0-90  90 Degrees    R Long PIP 0-100  100 Degrees    R Ring  MCP 0-90  90 Degrees    R Ring PIP 0-100  100 Degrees    R Little  MCP 0-90  90 Degrees    R Little PIP 0-100  100 Degrees      Left Hand AROM   L Thumb Opposition to Index  --   WNL   L Index  MCP 0-90  75 Degrees    L Index PIP 0-100  100 Degrees    L Long  MCP 0-90  90 Degrees    L Long PIP 0-100  100 Degrees    L Ring  MCP 0-90  90 Degrees    L Ring PIP 0-100  100 Degrees    L Little  MCP 0-90  90 Degrees    L Little PIP 0-100  100 Degrees          contrast done and MC block splint made for 3rd digit - and wrist splint provided for use at night time  Ed pt and daughter   Contrast 3 x day  Ice massage several time during day   [facility] block splint at night time  And with wrist splints that she had DR Jefm Bryant gave in past - but do not work anymore  Avoid tight grip             OT Education - 05/09/19 1605    Education Details  findings of EVAL and HEP    Person(s) Educated  Patient    Methods  Explanation;Demonstration;Tactile cues;Verbal cues;Handout    Comprehension  Verbal cues required;Returned demonstration;Verbalized understanding          OT Long Term Goals - 05/09/19 1609      OT LONG TERM GOAL #1   Title  Pt  and daughter to be independent in HEP to decrease pain in R hand to less than 2/10 at the worse    Baseline  no swelling , but pain 4/10 at rest and fisting    Time  8    Period  Weeks    Status  New            Plan - 05/09/19 1606    Clinical Impression Statement  Pt present at OT eval with diagnosis of R hand pain - with tenosynovitis and RA - pt tender over A1pulley of 3rd the most and some on 2nd and 5th - but no triggering -and AROM for fisting WNL - pt do show decrease grip strenght in R but also in L - had CVA in Nov 2020 that cause weakness on L side - pt pain 4/10 - pt and daughter ed on HEP and MC block splint for night time use and to avoid tight grips    OT Occupational Profile and History  Problem Focused Assessment - Including review of records relating to presenting problem    Occupational performance deficits (Please refer to evaluation for details):  ADL's;IADL's;Rest and Sleep    Body Structure / Function / Physical Skills  Pain;Strength    Rehab Potential  Fair    Clinical Decision Making  Limited treatment options, no task modification necessary    Comorbidities Affecting Occupational Performance:  May have comorbidities impacting occupational performance   CVA in past , RA, trigger finger on 2nd  in Febr   Modification or Assistance to Complete Evaluation   No modification of tasks or assist necessary to complete eval    OT Frequency  Biweekly    OT Duration  8 weeks    OT Treatment/Interventions  Self-care/ADL training;Splinting;Patient/family education;Manual Therapy;Therapeutic exercise;Contrast [facility];Ultrasound    Plan  assess pain decrease with HEP    Consulted and Agree with Plan of Care  Patient       Patient will benefit from skilled therapeutic intervention in order to improve the following deficits and impairments:   Body Structure / Function / Physical Skills: Pain, Strength       Visit Diagnosis: Pain in right hand - Plan: Ot plan of care  cert/re-cert  Muscle weakness (generalized) - Plan: Ot plan of care cert/re-cert    Problem List Patient Active Problem List   Diagnosis Date Noted  . Dysarthria 12/29/2018  . Tobacco use disorder 12/29/2018  . Vascular dementia (HCC) 12/29/2018  . Age-related osteoporosis without current pathological fracture 12/29/2018  . Acute ischemic stroke (HCC) 12/28/2018  . Mild protein-calorie malnutrition (HCC) 12/27/2017  . Vitamin D deficiency 12/20/2014  . Macular degeneration 05/24/2013  . Rheumatoid arthritis (HCC) 05/03/2013    Oletta Cohn OTR/l,CLT 05/09/2019, 4:13 PM  [place] [facility] PHYSICAL AND SPORTS MEDICINE [address], [place], [address] Phone: [phone]   Fax:  [phone]  Name: Christine Mccullough MRN: [id number] Date of Birth: [birth year]

## 2019-05-09 NOTE — Patient Instructions (Signed)
Contrast 3 x day  Ice massage several time during day   MC block splint at night time  And with wrist splints that she had DR Gavin Potters gave in past - but do not work anymore  Avoid tight grip

## 2019-05-23 ENCOUNTER — Ambulatory Visit: Payer: Medicare Other | Admitting: Occupational Therapy

## 2020-01-07 ENCOUNTER — Emergency Department: Payer: Medicare Other

## 2020-01-07 ENCOUNTER — Other Ambulatory Visit: Payer: Self-pay

## 2020-01-07 ENCOUNTER — Observation Stay: Payer: Medicare Other

## 2020-01-07 ENCOUNTER — Encounter: Payer: Self-pay | Admitting: Emergency Medicine

## 2020-01-07 ENCOUNTER — Inpatient Hospital Stay
Admission: EM | Admit: 2020-01-07 | Discharge: 2020-01-12 | DRG: 872 | Disposition: A | Discharge status: Skilled Nursing Facility | Payer: Medicare Other | Attending: Internal Medicine | Admitting: Internal Medicine

## 2020-01-07 DIAGNOSIS — Z888 Allergy status to other drugs, medicaments and biological substances status: Secondary | ICD-10-CM

## 2020-01-07 DIAGNOSIS — F17210 Nicotine dependence, cigarettes, uncomplicated: Secondary | ICD-10-CM | POA: Diagnosis present

## 2020-01-07 DIAGNOSIS — Z885 Allergy status to narcotic agent status: Secondary | ICD-10-CM

## 2020-01-07 DIAGNOSIS — N390 Urinary tract infection, site not specified: Secondary | ICD-10-CM | POA: Diagnosis not present

## 2020-01-07 DIAGNOSIS — Z681 Body mass index (BMI) 19 or less, adult: Secondary | ICD-10-CM

## 2020-01-07 DIAGNOSIS — F32A Depression, unspecified: Secondary | ICD-10-CM | POA: Diagnosis present

## 2020-01-07 DIAGNOSIS — Z79899 Other long term (current) drug therapy: Secondary | ICD-10-CM

## 2020-01-07 DIAGNOSIS — E559 Vitamin D deficiency, unspecified: Secondary | ICD-10-CM | POA: Diagnosis present

## 2020-01-07 DIAGNOSIS — Z8249 Family history of ischemic heart disease and other diseases of the circulatory system: Secondary | ICD-10-CM

## 2020-01-07 DIAGNOSIS — E441 Mild protein-calorie malnutrition: Secondary | ICD-10-CM | POA: Diagnosis present

## 2020-01-07 DIAGNOSIS — N12 Tubulo-interstitial nephritis, not specified as acute or chronic: Secondary | ICD-10-CM | POA: Diagnosis present

## 2020-01-07 DIAGNOSIS — Z8673 Personal history of transient ischemic attack (TIA), and cerebral infarction without residual deficits: Secondary | ICD-10-CM

## 2020-01-07 DIAGNOSIS — Z20822 Contact with and (suspected) exposure to covid-19: Secondary | ICD-10-CM | POA: Diagnosis present

## 2020-01-07 DIAGNOSIS — R350 Frequency of micturition: Secondary | ICD-10-CM | POA: Diagnosis not present

## 2020-01-07 DIAGNOSIS — R531 Weakness: Secondary | ICD-10-CM

## 2020-01-07 DIAGNOSIS — A4151 Sepsis due to Escherichia coli [E. coli]: Principal | ICD-10-CM | POA: Diagnosis present

## 2020-01-07 DIAGNOSIS — Z791 Long term (current) use of non-steroidal anti-inflammatories (NSAID): Secondary | ICD-10-CM

## 2020-01-07 DIAGNOSIS — F419 Anxiety disorder, unspecified: Secondary | ICD-10-CM | POA: Diagnosis present

## 2020-01-07 DIAGNOSIS — I471 Supraventricular tachycardia: Secondary | ICD-10-CM | POA: Diagnosis not present

## 2020-01-07 DIAGNOSIS — A419 Sepsis, unspecified organism: Secondary | ICD-10-CM | POA: Diagnosis not present

## 2020-01-07 DIAGNOSIS — Z66 Do not resuscitate: Secondary | ICD-10-CM | POA: Diagnosis present

## 2020-01-07 DIAGNOSIS — Z7982 Long term (current) use of aspirin: Secondary | ICD-10-CM

## 2020-01-07 DIAGNOSIS — M810 Age-related osteoporosis without current pathological fracture: Secondary | ICD-10-CM | POA: Diagnosis present

## 2020-01-07 DIAGNOSIS — M0579 Rheumatoid arthritis with rheumatoid factor of multiple sites without organ or systems involvement: Secondary | ICD-10-CM | POA: Diagnosis present

## 2020-01-07 DIAGNOSIS — Z7902 Long term (current) use of antithrombotics/antiplatelets: Secondary | ICD-10-CM

## 2020-01-07 DIAGNOSIS — M8588 Other specified disorders of bone density and structure, other site: Secondary | ICD-10-CM | POA: Diagnosis present

## 2020-01-07 DIAGNOSIS — F17200 Nicotine dependence, unspecified, uncomplicated: Secondary | ICD-10-CM | POA: Diagnosis present

## 2020-01-07 DIAGNOSIS — M069 Rheumatoid arthritis, unspecified: Secondary | ICD-10-CM | POA: Diagnosis present

## 2020-01-07 DIAGNOSIS — F0150 Vascular dementia without behavioral disturbance: Secondary | ICD-10-CM | POA: Diagnosis present

## 2020-01-07 HISTORY — DX: Cerebral infarction, unspecified: I63.9

## 2020-01-07 LAB — URINALYSIS, COMPLETE (UACMP) WITH MICROSCOPIC
Bilirubin Urine: NEGATIVE
Glucose, UA: NEGATIVE mg/dL
Ketones, ur: 5 mg/dL — AB
Nitrite: NEGATIVE
Protein, ur: 30 mg/dL — AB
Specific Gravity, Urine: 1.014 (ref 1.005–1.030)
Squamous Epithelial / HPF: NONE SEEN (ref 0–5)
pH: 6 (ref 5.0–8.0)

## 2020-01-07 LAB — RESP PANEL BY RT-PCR (FLU A&B, COVID) ARPGX2
Influenza A by PCR: NEGATIVE
Influenza B by PCR: NEGATIVE
SARS Coronavirus 2 by RT PCR: NEGATIVE

## 2020-01-07 LAB — CBC
HCT: 38.2 % (ref 36.0–46.0)
Hemoglobin: 12.4 g/dL (ref 12.0–15.0)
MCH: 33.2 pg (ref 26.0–34.0)
MCHC: 32.5 g/dL (ref 30.0–36.0)
MCV: 102.1 fL — ABNORMAL HIGH (ref 80.0–100.0)
Platelets: 338 10*3/uL (ref 150–400)
RBC: 3.74 MIL/uL — ABNORMAL LOW (ref 3.87–5.11)
RDW: 14.2 % (ref 11.5–15.5)
WBC: 22.5 10*3/uL — ABNORMAL HIGH (ref 4.0–10.5)
nRBC: 0 % (ref 0.0–0.2)

## 2020-01-07 LAB — BASIC METABOLIC PANEL
Anion gap: 13 (ref 5–15)
BUN: 31 mg/dL — ABNORMAL HIGH (ref 8–23)
CO2: 23 mmol/L (ref 22–32)
Calcium: 9.2 mg/dL (ref 8.9–10.3)
Chloride: 102 mmol/L (ref 98–111)
Creatinine, Ser: 1.16 mg/dL — ABNORMAL HIGH (ref 0.44–1.00)
GFR, Estimated: 47 mL/min — ABNORMAL LOW (ref >60)
Glucose, Bld: 116 mg/dL — ABNORMAL HIGH (ref 70–99)
Potassium: 4.7 mmol/L (ref 3.5–5.1)
Sodium: 138 mmol/L (ref 135–145)

## 2020-01-07 LAB — LACTIC ACID, PLASMA: Lactic Acid, Venous: 1.1 mmol/L (ref 0.5–1.9)

## 2020-01-07 LAB — TSH: TSH: 1.321 u[IU]/mL (ref 0.350–4.500)

## 2020-01-07 MED ORDER — LACTATED RINGERS IV SOLN: INTRAVENOUS | Status: AC

## 2020-01-07 MED ORDER — SODIUM CHLORIDE 0.9 % IV SOLN
1.0000 g | Freq: Once | INTRAVENOUS | Status: AC
  Administered 2020-01-07: 1 g via INTRAVENOUS
  Filled 2020-01-07: qty 10

## 2020-01-07 MED ORDER — LORAZEPAM 2 MG/ML IJ SOLN
INTRAMUSCULAR | Status: AC
  Administered 2020-01-07: 1 mg via INTRAVENOUS
  Filled 2020-01-07: qty 1

## 2020-01-07 MED ORDER — LACTATED RINGERS IV BOLUS
1000.0000 mL | Freq: Once | INTRAVENOUS | Status: AC
  Administered 2020-01-07: 1000 mL via INTRAVENOUS

## 2020-01-07 MED ORDER — IOHEXOL 300 MG/ML  SOLN
75.0000 mL | Freq: Once | INTRAMUSCULAR | Status: AC | PRN
  Administered 2020-01-07: 75 mL via INTRAVENOUS

## 2020-01-07 MED ORDER — ACETAMINOPHEN 650 MG RE SUPP
650.0000 mg | Freq: Once | RECTAL | Status: AC
  Administered 2020-01-07: 650 mg via RECTAL
  Filled 2020-01-07: qty 1

## 2020-01-07 MED ORDER — IOHEXOL 9 MG/ML PO SOLN
500.0000 mL | ORAL | Status: AC
  Administered 2020-01-07: 500 mL via ORAL

## 2020-01-07 MED ORDER — SODIUM CHLORIDE 0.9 % IV SOLN
1.0000 g | INTRAVENOUS | Status: DC
  Administered 2020-01-08: 1 g via INTRAVENOUS
  Filled 2020-01-07: qty 10
  Filled 2020-01-07: qty 1

## 2020-01-07 MED ORDER — ACETAMINOPHEN 325 MG PO TABS
325.0000 mg | ORAL_TABLET | Freq: Four times a day (QID) | ORAL | Status: AC | PRN
  Administered 2020-01-08 (×2): 325 mg via ORAL
  Filled 2020-01-07 (×2): qty 1

## 2020-01-07 MED ORDER — ACETAMINOPHEN 500 MG PO TABS
ORAL_TABLET | ORAL | Status: AC
  Filled 2020-01-07: qty 2

## 2020-01-07 MED ORDER — METOPROLOL TARTRATE 5 MG/5ML IV SOLN
2.5000 mg | INTRAVENOUS | Status: DC | PRN
  Administered 2020-01-07: 2.5 mg via INTRAVENOUS
  Filled 2020-01-07: qty 5

## 2020-01-07 MED ORDER — LORAZEPAM 2 MG/ML IJ SOLN: 1.0000 mg | Freq: Once | INTRAMUSCULAR | Status: AC

## 2020-01-07 MED ORDER — ENOXAPARIN SODIUM 30 MG/0.3ML ~~LOC~~ SOLN
30.0000 mg | SUBCUTANEOUS | Status: DC
  Administered 2020-01-07 – 2020-01-11 (×5): 30 mg via SUBCUTANEOUS
  Filled 2020-01-07 (×6): qty 0.3

## 2020-01-07 NOTE — ED Notes (Signed)
ED Provider at bedside. 

## 2020-01-07 NOTE — Progress Notes (Signed)
PHARMACIST - PHYSICIAN COMMUNICATION  CONCERNING:  Enoxaparin (Lovenox) for DVT Prophylaxis    RECOMMENDATION: Patient was prescribed enoxaparin 40mg  q24 hours for VTE prophylaxis.   Filed Weights   01/07/20 1413  Weight: 46.7 kg (103 lb)    Body mass index is 18.25 kg/m.  Estimated Creatinine Clearance: 27.1 mL/min (A) (by C-G formula based on SCr of 1.16 mg/dL (H)).  Patient is candidate for enoxaparin 30mg  every 24 hours based on CrCl <20ml/min or Weight <45kg  DESCRIPTION: Pharmacy has adjusted enoxaparin dose per [facility] policy.  Patient is now receiving enoxaparin 30 mg every 24 hours    31m 01/07/2020 7:37 PM

## 2020-01-07 NOTE — ED Triage Notes (Addendum)
Pt arrived via POV with daughter, reports pt has had frequent urination since Friday and increased weakness since then and now unable to walk.   Pt did have an unwitnessed fall yesterday on R knee and has R ankle swelling. Pt normally uses a walker.  Pt denies any head injury, denies head and neck pain.  Pt recevied Prolia injection on 11/16

## 2020-01-07 NOTE — ED Notes (Signed)
Patient transported to CT 

## 2020-01-07 NOTE — ED Provider Notes (Signed)
[facility] Emergency Department Provider Note  ____________________________________________   I have reviewed the triage vital signs and the nursing notes.   HISTORY  Chief Complaint Urinary frequency  History limited by: Not Limited   HPI Christine Mccullough is a 85 y.o. female who presents to the emergency department today accompanied by a family from [facility] because of concerns for urinary tract infection.  Patient went to [facility] clinic today because of concerns for increased urinary frequency that started 2 days ago.  Patient is also had increased weakness.  It has been so significant that it has caused a fall.  Family had noticed some tenderness to the right knee and swelling to the right ankle after the fall.  The patient herself denies any significant pain.  No chest pain shortness of breath or cough.  No fevers at home.  Per family patient does have a history of urinary tract infections.   Records reviewed. Per medical record review patient has a history of dementia.  Past Medical History:  Diagnosis Date  . Arthritis   . Dementia (HCC)   . Stroke [facility])     Patient Active Problem List   Diagnosis Date Noted  . Dysarthria 12/29/2018  . Tobacco use disorder 12/29/2018  . Vascular dementia (HCC) 12/29/2018  . Age-related osteoporosis without current pathological fracture 12/29/2018  . Acute ischemic stroke (HCC) 12/28/2018  . Mild protein-calorie malnutrition (HCC) 12/27/2017  . Vitamin D deficiency 12/20/2014  . Macular degeneration 05/24/2013  . Rheumatoid arthritis (HCC) 05/03/2013    History reviewed. No pertinent surgical history.  Prior to Admission medications   Medication Sig Start Date End Date Taking? Authorizing Provider  aspirin EC 81 MG EC tablet Take 1 tablet (81 mg total) by mouth daily. 12/30/18   Eliezer Bottom, MD  atorvastatin (LIPITOR) 80 MG tablet Take 1 tablet (80 mg total) by mouth daily at 6 PM. Patient not taking:  Reported on 05/09/2019 12/29/18   Eliezer Bottom, MD  Cholecalciferol (VITAMIN D-1000 MAX ST) 25 MCG (1000 UT) tablet Take 1,000 Units by mouth daily. 11/03/16   [provider]  clopidogrel (PLAVIX) 75 MG tablet Take 1 tablet (75 mg total) by mouth daily. Patient not taking: Reported on 05/09/2019 12/29/18   Eliezer Bottom, MD  donepezil (ARICEPT) 10 MG tablet Take 10 mg by mouth daily. 11/25/18   [provider]  folic acid (FOLVITE) 1 MG tablet Take 1 mg by mouth daily.    [provider]  ibuprofen (ADVIL) 200 MG tablet Take 400-600 mg by mouth every 6 (six) hours as needed for fever, headache or mild pain.    [provider]  methotrexate 2.5 MG tablet Take 15 mg by mouth every Sunday.  11/30/18   [provider]    Allergies Codeine, Propoxyphene, and Pneumococcal polysaccharide vaccine  Family History  Problem Relation Age of Onset  . Hypertension Mother   . Hypertension Father     Social History Social History   Tobacco Use  . Smoking status: Current Every Day Smoker    Packs/day: 0.50    Types: Cigarettes  . Smokeless tobacco: Never Used  Substance Use Topics  . Alcohol use: Yes    Comment: occasional wine  . Drug use: Not on file    Review of Systems Constitutional: No fever/chills Eyes: No visual changes. ENT: No sore throat. Cardiovascular: Denies chest pain. Respiratory: Denies shortness of breath. Gastrointestinal: No abdominal pain.  No nausea, no vomiting.  No diarrhea.  Genitourinary: Positive for increased urinary frequency. Musculoskeletal: Positive for right ankle swelling.  Skin: Negative for rash. Neurological: Negative for headaches, focal weakness or numbness.  ____________________________________________   PHYSICAL EXAM:  VITAL SIGNS: ED Triage Vitals  Enc Vitals Group     BP 01/07/20 1408 (!) 150/89     Pulse Rate 01/07/20 1408 100     Resp 01/07/20 1408 (!) 22     Temp 01/07/20 1408 100.2 F (37.9  C)     Temp Source 01/07/20 1408 Oral     SpO2 01/07/20 1408 97 %     Weight 01/07/20 1413 103 lb (46.7 kg)     Height 01/07/20 1413 5\' 3"  (1.6 m)   Constitutional: Alert and oriented.  Eyes: Conjunctivae are normal.  ENT      Head: Normocephalic and atraumatic.      Nose: No congestion/rhinnorhea.      Mouth/Throat: Mucous membranes are moist.      Neck: No stridor. Hematological/Lymphatic/Immunilogical: No cervical lymphadenopathy. Cardiovascular: Normal rate, regular rhythm.  No murmurs, rubs, or gallops.  Respiratory: Normal respiratory effort without tachypnea nor retractions. Breath sounds are clear and equal bilaterally. No wheezes/rales/rhonchi. Gastrointestinal: Soft and non tender. No rebound. No guarding.  Genitourinary: Deferred Musculoskeletal: Normal range of motion in all extremities. No lower extremity edema. Neurologic:  Normal speech and language. No gross focal neurologic deficits are appreciated.  Skin:  Skin is warm, dry and intact. No rash noted. Psychiatric: Mood and affect are normal. Speech and behavior are normal. Patient exhibits appropriate insight and judgment.  ____________________________________________    LABS (pertinent positives/negatives)  CBC wbc 22.5, hgb 12.4, plt 338 BMP na 138, k 4.7, glu 116, cr 1.16 Lactic acid 1.1 ____________________________________________   EKG  I, , attending physician, personally viewed and interpreted this EKG  EKG Time: 1415 Rate: 102 Rhythm: sinus tachycardia Axis: normal Intervals: qtc 432 QRS: narrow ST changes: no st elevation Impression: abnormal ekg  ____________________________________________    RADIOLOGY  CXR Peribronchial thickening and interstitial prominence of the lungs.  ____________________________________________   PROCEDURES  Procedures  ____________________________________________   INITIAL IMPRESSION / ASSESSMENT AND PLAN / ED COURSE  Pertinent labs  & imaging results that were available during my care of the patient were reviewed by me and considered in my medical decision making (see chart for details).   Patient presented to the emergency department today because of concerns for possible sepsis.  Patient was sent from [facility] clinic where she went today for increased urinary frequency and weakness.  Patient is here due to chills.  Patient's blood work here shows significant leukocytosis.  Patient was tachycardic and tachypneic here.  Chest x-ray without obvious pneumonia.  At this time I do think likely patient symptoms secondary urinary tract function.  Will plan on admission. ____________________________________________   FINAL CLINICAL IMPRESSION(S) / ED DIAGNOSES  Final diagnoses:  Weakness  Lower urinary tract infectious disease     Note: This dictation was prepared with Dragon dictation. Any transcriptional errors that result from this process are unintentional     [facility], MD 01/07/20 1940

## 2020-01-07 NOTE — H&P (Signed)
History and Physical   Christine Mccullough PQA:[id number] DOB: 1934/06/21 DOA: 01/07/2020  PCP: Lynnea Ferrier, MD Outpatient Specialists: Dr. Helen Hashimoto Royann Shivers., [facility] Rheumatology Patient coming from: Home  I have personally briefly reviewed patient's old medical records in [facility] EMR.  Chief Concern: Fever  HPI: Christine Mccullough is a 85 y.o. female with medical history significant for rheumatoid arthritis involving multiple sites with positive rheumatoid factor, osteoporosis, tobacco abuse, presented to the emergency department from [facility] clinic for concerns of sepsis secondary to urinary tract infection.  Daughter at bedside states that she has been having increase urinary frequency for a couple of days.  Patient is ambulatory minimally around the house and she has been having increase trips to the restroom.  Per daughter, when patient woke up in the a.m. today, her bed was soaked in there appeared to be blood.  Patient herself denies headache, chest pain, fever, chills, shortness of breath, abdominal pain, suprapubic pain, dysuria, hematuria, diarrhea.  She states she wants to go home.  Social History: lives by herself with a 24 hour caregiver, smokes 1 carton of cigarettes per week (10 packs of cigarettes per week)  When I advised that she try not to smoke too much, patient states "I do as I please".  ED Course: ED provider requesting admission for sepsis secondary to urinary tract infection.  Review of Systems: As per HPI otherwise 10 point review of systems negative.   Assessment/Plan  Principal Problem:   Sepsis secondary to UTI [facility]) Active Problems:   Tobacco use disorder   Vascular dementia (HCC)   Mild protein-calorie malnutrition (HCC)   Rheumatoid arthritis (HCC)   Sepsis (HCC)   Sepsis secondary to UTI-query pyelonephritis -CT abdomen pelvis with contrast ordered -Status post ceftriaxone in the emergency department -Continue ceftriaxone IV 1  g every 24 hours -UA from [place] clinic showed small amount of leukocytes and negative nitrites -IVF  Leukocytosis and sinus tachycardia secondary to pyelonephritis versus UTI-as above, CBC in the a.m.  Tobacco use disorder-patient is not ready to quit  Mild protein calorie malnutrition-dietitian has been consulted  Vascular dementia-per daughter at bedside she is at her baseline mental status  Agitation-patient became very agitated when I recommended that she stay overnight for IV antibiotic and CT abdomen and pelvis to assess for pyelonephritis -One-time dose of Ativan 1 mg IV once  Chart reviewed.   DVT prophylaxis: Enoxaparin Code Status: DNR Diet: Cardiac Family Communication: Updated daughter at bedside Disposition Plan: Pending clinical course Consults called: None at this time Admission status: Observation with telemetry  Past Medical History:  Diagnosis Date  . Arthritis   . Dementia (HCC)   . Stroke [facility])    History reviewed. No pertinent surgical history.  Social History:  reports that she has been smoking cigarettes. She has been smoking about 0.50 packs per day. She has never used smokeless tobacco. She reports current alcohol use. No history on file for drug use.  Allergies  Allergen Reactions  . Atorvastatin Other (See Comments)  . Codeine Other (See Comments)    agitation  . Propoxyphene Nausea And Vomiting  . Pneumococcal Polysaccharide Vaccine Rash   Family History  Problem Relation Age of Onset  . Hypertension Mother   . Hypertension Father    Family history: Family history reviewed and not pertinent  Prior to Admission medications   Medication Sig Start Date End Date Taking? Authorizing Provider  aspirin EC 81 MG EC tablet Take 1 tablet (81  mg total) by mouth daily. 12/30/18   Eliezer Bottom, MD  atorvastatin (LIPITOR) 80 MG tablet Take 1 tablet (80 mg total) by mouth daily at 6 PM. Patient not taking: Reported on 05/09/2019 12/29/18   Eliezer Bottom, MD  Cholecalciferol (VITAMIN D-1000 MAX ST) 25 MCG (1000 UT) tablet Take 1,000 Units by mouth daily. 11/03/16   [provider]  clopidogrel (PLAVIX) 75 MG tablet Take 1 tablet (75 mg total) by mouth daily. Patient not taking: Reported on 05/09/2019 12/29/18   Eliezer Bottom, MD  donepezil (ARICEPT) 10 MG tablet Take 10 mg by mouth daily. 11/25/18   [provider]  folic acid (FOLVITE) 1 MG tablet Take 1 mg by mouth daily.    [provider]  ibuprofen (ADVIL) 200 MG tablet Take 400-600 mg by mouth every 6 (six) hours as needed for fever, headache or mild pain.    [provider]  methotrexate 2.5 MG tablet Take 15 mg by mouth every Sunday.  11/30/18   [provider]   Physical Exam: Vitals:   01/07/20 1730 01/07/20 1800 01/07/20 1930 01/07/20 2000  BP: (!) 136/121 134/76 134/75 (!) 158/98  Pulse: 94 98 95 (!) 126  Resp: 19 (!) 27 (!) 25 19  Temp:      TempSrc:      SpO2: 95% 97% 95% 97%  Weight:      Height:       Constitutional: appears age-appropriate, NAD, calm, comfortable Eyes: PERRL, lids and conjunctivae normal ENMT: Mucous membranes are moist. Posterior pharynx clear of any exudate or lesions. Age-appropriate dentition. Hearing appropriate Neck: normal, supple, no masses, no thyromegaly Respiratory: clear to auscultation bilaterally, no wheezing, no crackles. Normal respiratory effort. No accessory muscle use.  Cardiovascular: Regular rate and rhythm, no murmurs / rubs / gallops. No extremity edema. 2+ pedal pulses. No carotid bruits.  Abdomen: no tenderness, no masses palpated, no hepatosplenomegaly. Bowel sounds positive.  Musculoskeletal: no clubbing / cyanosis. No joint deformity upper and lower extremities. Good ROM, no contractures, no atrophy. Normal muscle tone.  Skin: no rashes, lesions, ulcers. No induration Neurologic: Sensation intact. Strength 5/5 in all 4.  Psychiatric: Normal judgment and insight. Alert and  oriented x 3. Normal mood.   EKG: Independently reviewed, showing sinus tachycardia with a rate of 102, QTc 432  Chest x-ray on Admission: Personally reviewed and I agree with radiologist reading as below.  DG Chest Portable 1 View  Result Date: 01/07/2020 CLINICAL DATA:  Tachypnea, fever EXAM: PORTABLE CHEST 1 VIEW COMPARISON:  None. FINDINGS: Heart is borderline in size. Mild peribronchial thickening and interstitial prominence throughout the lungs. Left basilar opacity, likely atelectasis. No effusions or acute bony abnormality. IMPRESSION: Peribronchial thickening and interstitial prominence of the lungs. Findings could reflect bronchitis either acute or chronic or atypical infection. Left base atelectasis. Electronically Signed   By: Charlett Nose M.D.   On: 01/07/2020 18:50   Labs on Admission: I have personally reviewed following labs  CBC: Recent Labs  Lab 01/07/20 1416  WBC 22.5*  HGB 12.4  HCT 38.2  MCV 102.1*  PLT 338   Basic Metabolic Panel: Recent Labs  Lab 01/07/20 1416  NA 138  K 4.7  CL 102  CO2 23  GLUCOSE 116*  BUN 31*  CREATININE 1.16*  CALCIUM 9.2   Urine analysis:    Component Value Date/Time   COLORURINE YELLOW 12/28/2018 0946   APPEARANCEUR CLEAR 12/28/2018 0946   LABSPEC 1.010 12/28/2018 0946   PHURINE  6.0 12/28/2018 0946   GLUCOSEU NEGATIVE 12/28/2018 0946   HGBUR NEGATIVE 12/28/2018 0946   BILIRUBINUR NEGATIVE 12/28/2018 0946   KETONESUR NEGATIVE 12/28/2018 0946   PROTEINUR NEGATIVE 12/28/2018 0946   NITRITE NEGATIVE 12/28/2018 0946   LEUKOCYTESUR TRACE (A) 12/28/2018 0946   Davidson Palmieri N Blandina Renaldo D.O. [facility]  If 12AM-7AM, please contact overnight-coverage provider If 7AM-7PM, please contact day coverage provider [email or web address]  01/07/2020, 8:17 PM

## 2020-01-07 NOTE — ED Notes (Signed)
First Nurse Note: Pt to ED from [facility] for positive UTI, pt was tachy at 112, temp of 99.7, and altered mental status.

## 2020-01-08 DIAGNOSIS — M069 Rheumatoid arthritis, unspecified: Secondary | ICD-10-CM | POA: Diagnosis present

## 2020-01-08 DIAGNOSIS — F0150 Vascular dementia without behavioral disturbance: Secondary | ICD-10-CM | POA: Diagnosis present

## 2020-01-08 DIAGNOSIS — M8588 Other specified disorders of bone density and structure, other site: Secondary | ICD-10-CM | POA: Diagnosis not present

## 2020-01-08 DIAGNOSIS — N390 Urinary tract infection, site not specified: Secondary | ICD-10-CM | POA: Diagnosis not present

## 2020-01-08 DIAGNOSIS — F17210 Nicotine dependence, cigarettes, uncomplicated: Secondary | ICD-10-CM | POA: Diagnosis present

## 2020-01-08 DIAGNOSIS — E559 Vitamin D deficiency, unspecified: Secondary | ICD-10-CM | POA: Diagnosis present

## 2020-01-08 DIAGNOSIS — Z681 Body mass index (BMI) 19 or less, adult: Secondary | ICD-10-CM | POA: Diagnosis not present

## 2020-01-08 DIAGNOSIS — F419 Anxiety disorder, unspecified: Secondary | ICD-10-CM | POA: Diagnosis present

## 2020-01-08 DIAGNOSIS — Z791 Long term (current) use of non-steroidal anti-inflammatories (NSAID): Secondary | ICD-10-CM | POA: Diagnosis not present

## 2020-01-08 DIAGNOSIS — Z8249 Family history of ischemic heart disease and other diseases of the circulatory system: Secondary | ICD-10-CM | POA: Diagnosis not present

## 2020-01-08 DIAGNOSIS — Z888 Allergy status to other drugs, medicaments and biological substances status: Secondary | ICD-10-CM | POA: Diagnosis not present

## 2020-01-08 DIAGNOSIS — N12 Tubulo-interstitial nephritis, not specified as acute or chronic: Secondary | ICD-10-CM | POA: Diagnosis present

## 2020-01-08 DIAGNOSIS — Z20822 Contact with and (suspected) exposure to covid-19: Secondary | ICD-10-CM | POA: Diagnosis present

## 2020-01-08 DIAGNOSIS — Z885 Allergy status to narcotic agent status: Secondary | ICD-10-CM | POA: Diagnosis not present

## 2020-01-08 DIAGNOSIS — Z8673 Personal history of transient ischemic attack (TIA), and cerebral infarction without residual deficits: Secondary | ICD-10-CM | POA: Diagnosis not present

## 2020-01-08 DIAGNOSIS — I471 Supraventricular tachycardia: Secondary | ICD-10-CM | POA: Diagnosis not present

## 2020-01-08 DIAGNOSIS — Z66 Do not resuscitate: Secondary | ICD-10-CM | POA: Diagnosis present

## 2020-01-08 DIAGNOSIS — F32A Depression, unspecified: Secondary | ICD-10-CM | POA: Diagnosis present

## 2020-01-08 DIAGNOSIS — Z79899 Other long term (current) drug therapy: Secondary | ICD-10-CM | POA: Diagnosis not present

## 2020-01-08 DIAGNOSIS — R350 Frequency of micturition: Secondary | ICD-10-CM | POA: Diagnosis present

## 2020-01-08 DIAGNOSIS — A4151 Sepsis due to Escherichia coli [E. coli]: Secondary | ICD-10-CM | POA: Diagnosis present

## 2020-01-08 DIAGNOSIS — E441 Mild protein-calorie malnutrition: Secondary | ICD-10-CM | POA: Diagnosis present

## 2020-01-08 DIAGNOSIS — A419 Sepsis, unspecified organism: Secondary | ICD-10-CM | POA: Diagnosis not present

## 2020-01-08 DIAGNOSIS — Z7982 Long term (current) use of aspirin: Secondary | ICD-10-CM | POA: Diagnosis not present

## 2020-01-08 DIAGNOSIS — Z7902 Long term (current) use of antithrombotics/antiplatelets: Secondary | ICD-10-CM | POA: Diagnosis not present

## 2020-01-08 DIAGNOSIS — M810 Age-related osteoporosis without current pathological fracture: Secondary | ICD-10-CM | POA: Diagnosis present

## 2020-01-08 DIAGNOSIS — M0579 Rheumatoid arthritis with rheumatoid factor of multiple sites without organ or systems involvement: Secondary | ICD-10-CM | POA: Diagnosis present

## 2020-01-08 LAB — CORTISOL-AM, BLOOD: Cortisol - AM: 37.6 ug/dL — ABNORMAL HIGH (ref 6.7–22.6)

## 2020-01-08 LAB — VITAMIN D 25 HYDROXY (VIT D DEFICIENCY, FRACTURES): Vit D, 25-Hydroxy: 41.13 ng/mL (ref 30–100)

## 2020-01-08 LAB — PROTIME-INR
INR: 1.2 (ref 0.8–1.2)
Prothrombin Time: 14.4 seconds (ref 11.4–15.2)

## 2020-01-08 LAB — CBC
HCT: 36.5 % (ref 36.0–46.0)
Hemoglobin: 11.6 g/dL — ABNORMAL LOW (ref 12.0–15.0)
MCH: 33 pg (ref 26.0–34.0)
MCHC: 31.8 g/dL (ref 30.0–36.0)
MCV: 104 fL — ABNORMAL HIGH (ref 80.0–100.0)
Platelets: 278 10*3/uL (ref 150–400)
RBC: 3.51 MIL/uL — ABNORMAL LOW (ref 3.87–5.11)
RDW: 14.2 % (ref 11.5–15.5)
WBC: 13.3 10*3/uL — ABNORMAL HIGH (ref 4.0–10.5)
nRBC: 0 % (ref 0.0–0.2)

## 2020-01-08 LAB — BASIC METABOLIC PANEL
Anion gap: 10 (ref 5–15)
BUN: 29 mg/dL — ABNORMAL HIGH (ref 8–23)
CO2: 25 mmol/L (ref 22–32)
Calcium: 8.5 mg/dL — ABNORMAL LOW (ref 8.9–10.3)
Chloride: 100 mmol/L (ref 98–111)
Creatinine, Ser: 1.02 mg/dL — ABNORMAL HIGH (ref 0.44–1.00)
GFR, Estimated: 55 mL/min — ABNORMAL LOW (ref >60)
Glucose, Bld: 84 mg/dL (ref 70–99)
Potassium: 4.3 mmol/L (ref 3.5–5.1)
Sodium: 135 mmol/L (ref 135–145)

## 2020-01-08 LAB — PROCALCITONIN: Procalcitonin: 0.75 ng/mL

## 2020-01-08 MED ORDER — SODIUM CHLORIDE 0.9 % IV SOLN: INTRAVENOUS | Status: AC

## 2020-01-08 MED ORDER — DONEPEZIL HCL 5 MG PO TABS
10.0000 mg | ORAL_TABLET | Freq: Every day | ORAL | Status: DC
  Administered 2020-01-08 – 2020-01-12 (×5): 10 mg via ORAL
  Filled 2020-01-08 (×5): qty 2

## 2020-01-08 MED ORDER — MIRTAZAPINE 15 MG PO TABS
7.5000 mg | ORAL_TABLET | Freq: Every day | ORAL | Status: DC
  Administered 2020-01-08 – 2020-01-11 (×4): 7.5 mg via ORAL
  Filled 2020-01-08 (×4): qty 1

## 2020-01-08 MED ORDER — ASPIRIN EC 81 MG PO TBEC: 81.0000 mg | DELAYED_RELEASE_TABLET | Freq: Every day | ORAL | Status: DC

## 2020-01-08 MED ORDER — FOLIC ACID 1 MG PO TABS
1.0000 mg | ORAL_TABLET | Freq: Every day | ORAL | Status: DC
  Administered 2020-01-08 – 2020-01-12 (×5): 1 mg via ORAL
  Filled 2020-01-08 (×5): qty 1

## 2020-01-08 MED ORDER — METHOTREXATE 2.5 MG PO TABS: 20.0000 mg | ORAL_TABLET | ORAL | Status: DC

## 2020-01-08 NOTE — Hospital Course (Signed)
Christine Mccullough is a 85 y.o. female with medical history significant for rheumatoid arthritis involving multiple sites with positive rheumatoid factor, osteoporosis, tobacco abuse, presented to the ED on 01/07/20 from [place] clinic for concerns of sepsis secondary to urinary tract infection when seen for complaint of urinary frequency and incontinence.    Patient met sepsis criteria on admission with fever, tachycardia, leukocytosis and UA consistent with infection.  Lactic acid was normal.  CT abdomen/pelvis showed findings of cystitis and possible left pyelonephritis as well.  Patient is on empiric Rocephin pending cultures.

## 2020-01-08 NOTE — Progress Notes (Signed)
   01/08/20 0748  Assess: MEWS Score  Temp 99.8 F (37.7 C)  BP 134/79  Pulse Rate (!) 115  Resp (!) 23  Level of Consciousness Alert  SpO2 99 %  O2 Device Nasal Cannula  O2 Flow Rate (L/min) 2 L/min  Assess: MEWS Score  MEWS Temp 0  MEWS Systolic 0  MEWS Pulse 2  MEWS RR 1  MEWS LOC 0  MEWS Score 3  MEWS Score Color Yellow  Assess: if the MEWS score is Yellow or Red  Were vital signs taken at a resting state? Yes  Focused Assessment No change from prior assessment  Early Detection of Sepsis Score *See Row Information* High  MEWS guidelines implemented *See Row Information* Yes  Treat  MEWS Interventions Escalated (See documentation below)  Pain Scale 0-10  Pain Score 0  Take Vital Signs  Increase Vital Sign Frequency  Yellow: Q 2hr X 2 then Q 4hr X 2, if remains yellow, continue Q 4hrs  Escalate  MEWS: Escalate Yellow: discuss with charge nurse/RN and consider discussing with provider and RRT  Notify: Charge Nurse/RN  Name of Charge Nurse/RN Notified Jack,RN  Date Charge Nurse/RN Notified 01/08/20  Time Charge Nurse/RN Notified [phone]

## 2020-01-08 NOTE — Progress Notes (Signed)
Mobility Specialist - Progress Note   01/08/20 1342  Mobility  Activity Stood at bedside  Level of Assistance Minimal assist, patient does 75% or more  Assistive Device Front wheel walker  Distance Ambulated (ft) 0 ft  Mobility Response Tolerated well  Mobility performed by Mobility specialist  $Mobility charge 1 Mobility    Pre-mobility: 102 HR, 104/62 BP, 98% SpO2 Post-mobility: 92 HR, 97% SpO2   Pt was lying in bed upon arrival utilizing 2L [place] O2. Pt agreed to session. Pt's daughter at bedside. Per discussion with daughter, pt had been c/o feeling "hot" this afternoon. Pt's temperature was taken reading at 99.2. Nurse entered and was notified of vitals. Pt denied any pain or nausea. Pt voiced that she had a BM on self. Pt was able to get EOB with SBA. Pt stood to RW with no c/o dizziness or weakness. Mobility assisted with hygiene. Noted posterior lean upon standing that was corrected temporarily with cueing. Pt returned EOB-supine with supervision. Daughter assisted with repositioning to [facility]. Overall, pt tolerated session well. Pt was left in bed with all needs in reach and alarm set.    Filiberto Pinks Mobility Specialist 01/08/20, 1:51 PM

## 2020-01-08 NOTE — Progress Notes (Signed)
PROGRESS NOTE    Christine Mccullough   MRN:[id number]  DOB: 08/30/1934  PCP: Klein, Bert J III, MD    DOA: 01/07/2020 LOS: 0   Brief Narrative   Christine Mccullough is a 85 y.o. female with medical history significant for rheumatoid arthritis involving multiple sites with positive rheumatoid factor, osteoporosis, tobacco abuse, presented to the ED on 01/07/20 from [facility] for concerns of sepsis secondary to urinary tract infection when seen for complaint of urinary frequency and incontinence.    Patient met sepsis criteria on admission with fever, tachycardia, leukocytosis and UA consistent with infection.  Lactic acid was normal.  CT abdomen/pelvis showed findings of cystitis and possible left pyelonephritis as well.  Patient is on empiric Rocephin pending cultures.          Assessment & Plan   Principal Problem:   Sepsis secondary to UTI (HCC) Active Problems:   Tobacco use disorder   Vascular dementia (HCC)   Mild protein-calorie malnutrition (HCC)   Rheumatoid arthritis (HCC)   Sepsis (HCC)   Mass of hard palate   Sepsis secondary to UTI with left pyelonephritis - sepsis POA as outlined above.   Continue empiric Rocephin pending cultures.  No urine culture was obtained that I could see - ordered for add-on to initial sample if possible. IV fluids, maintenance NS @ 75 cc/hr until eating & drinking well and tachycardia improves. Hold home ASA for now since UA showed hematuria.  Mild protein-calorie malnutrition - dietician consulted.  Continue Remeron for appetite. Patient BMI: Body mass index is 19.33 kg/m.   Tobacco use disorder - counseled on cessation  Vascular dementia - appears stable and at baseline per daughter.  Monitor. Continue Aricept.  Rheumatoid arthritis - takes methotrexate and folic acid - continue.  Mass of hard palate - chronic, no acute issues. Monitor.  ?Depression/Anxiety - continue Remeron  Vitamin D deficiency - on supplement at home. Okay  to hold for now.   DVT prophylaxis: enoxaparin (LOVENOX) injection 30 mg Start: 01/07/20 2200 Place TED hose Start: 01/07/20 1926   Diet:  Diet Orders (From admission, onward)    Start     Ordered   01/07/20 1927  Diet Heart Room service appropriate? Yes; Fluid consistency: Thin  Diet effective now       Question Answer Comment  Room service appropriate? Yes   Fluid consistency: Thin      01/07/20 1929            Code Status: DNR    Subjective 01/08/20    Pt seen with daughter at bedside.  Pt sleepy but responsive.  Denies pain, fever/chills, nausea/vomiting or other complaints.  Says is tired.  Daughter states she was given Ativan last night.  Plan of care discussed with daughter.   Disposition Plan & Communication   Status is: Inpatient  Remains inpatient appropriate because:IV treatments appropriate due to intensity of illness or inability to take PO   Dispo: The patient is from: Home              Anticipated d/c is to: Home              Anticipated d/c date is: 2 days              Patient currently is not medically stable to d/c.    Family Communication: daughter at bedside during rounds   Consults, Procedures, Significant Events   Consultants:   None  Procedures:   None  Antimicrobials:    Anti-infectives (From admission, onward)   Start     Dose/Rate Route Frequency Ordered Stop   01/08/20 1800  cefTRIAXone (ROCEPHIN) 1 g in sodium chloride 0.9 % 100 mL IVPB        1 g 200 mL/hr over 30 Minutes Intravenous Every 24 hours 01/07/20 1929     01/07/20 1730  cefTRIAXone (ROCEPHIN) 1 g in sodium chloride 0.9 % 100 mL IVPB        1 g 200 mL/hr over 30 Minutes Intravenous  Once 01/07/20 1722 01/07/20 1809        Objective   Vitals:   01/07/20 2053 01/07/20 2146 01/08/20 0359 01/08/20 0748  BP:  114/66 118/66 134/79  Pulse: 99 (!) 103 95 (!) 115  Resp: (!) _0 (!) 23  Temp:  98.6 F (37 C) 97.6 F (36.4 C) 99.8 F (37.7 C)  TempSrc:  Oral   Oral  SpO2: 98% 99% 98% 99%  Weight:  49.5 kg    Height:  5' 3" (1.6 m)      Intake/Output Summary (Last 24 hours) at 01/08/2020 1006 Last data filed at 01/08/2020 0346 Gross per 24 hour  Intake 812.67 ml  Output --  Net 812.67 ml   Filed Weights   01/07/20 1413 01/07/20 2146  Weight: 46.7 kg 49.5 kg    Physical Exam:  General exam: sleeping but arouses to voice, no acute distress, frail Respiratory system: CTAB, no wheezes, rales or rhonchi, normal respiratory effort. Cardiovascular system: normal S1/S2, tachycardic, regular rhythm, no pedal edema.   Gastrointestinal system: soft, NT, ND, +bowel sounds. Extremities: moves all, no edema, no cyanosis Skin: dry, intact, normal temperature, normal color   Labs   Data Reviewed: I have personally reviewed following labs and imaging studies  CBC: Recent Labs  Lab 01/07/20 1416 01/08/20 0558  WBC 22.5* 13.3*  HGB 12.4 11.6*  HCT 38.2 36.5  MCV 102.1* 104.0*  PLT 338 010   Basic Metabolic Panel: Recent Labs  Lab 01/07/20 1416 01/08/20 0558  NA 138 135  K 4.7 4.3  CL 102 100  CO2 23 25  GLUCOSE 116* 84  BUN 31* 29*  CREATININE 1.16* 1.02*  CALCIUM 9.2 8.5*   GFR: Estimated Creatinine Clearance: 32.7 mL/min (A) (by C-G formula based on SCr of 1.02 mg/dL (H)). Liver Function Tests: No results for input(s): AST, ALT, ALKPHOS, BILITOT, PROT, ALBUMIN in the last 168 hours. No results for input(s): LIPASE, AMYLASE in the last 168 hours. No results for input(s): AMMONIA in the last 168 hours. Coagulation Profile: Recent Labs  Lab 01/08/20 0558  INR 1.2   Cardiac Enzymes: No results for input(s): CKTOTAL, CKMB, CKMBINDEX, TROPONINI in the last 168 hours. BNP (last 3 results) No results for input(s): PROBNP in the last 8760 hours. HbA1C: No results for input(s): HGBA1C in the last 72 hours. CBG: No results for input(s): GLUCAP in the last 168 hours. Lipid Profile: No results for input(s): CHOL, HDL,  LDLCALC, TRIG, CHOLHDL, LDLDIRECT in the last 72 hours. Thyroid Function Tests: Recent Labs    01/07/20 1416  TSH 1.321   Anemia Panel: No results for input(s): VITAMINB12, FOLATE, FERRITIN, TIBC, IRON, RETICCTPCT in the last 72 hours. Sepsis Labs: Recent Labs  Lab 01/07/20 1700 01/08/20 0558  PROCALCITON  --  0.75  LATICACIDVEN 1.1  --     Recent Results (from the past 240 hour(s))  Blood Culture (routine x 2)     Status: None (Preliminary result)  Collection Time: 01/07/20  5:08 PM   Specimen: BLOOD  Result Value Ref Range Status   Specimen Description BLOOD RA  Final   Special Requests   Final    BOTTLES DRAWN AEROBIC AND ANAEROBIC Blood Culture adequate volume   Culture   Final    NO GROWTH < 12 HOURS Performed at [facility], [address], [place], [place] [address]    Report Status PENDING  Incomplete  Blood Culture (routine x 2)     Status: None (Preliminary result)   Collection Time: 01/07/20  5:09 PM   Specimen: BLOOD  Result Value Ref Range Status   Specimen Description BLOOD LAC  Final   Special Requests   Final    BOTTLES DRAWN AEROBIC AND ANAEROBIC Blood Culture results may not be optimal due to an inadequate volume of blood received in culture bottles   Culture   Final    NO GROWTH < 12 HOURS Performed at [facility], [address] [place], [place] [address]    Report Status PENDING  Incomplete  Resp Panel by RT-PCR (Flu A&B, Covid) Nasopharyngeal Swab     Status: None   Collection Time: 01/07/20  7:37 PM   Specimen: Nasopharyngeal Swab; Nasopharyngeal(NP) swabs in vial transport medium  Result Value Ref Range Status   SARS Coronavirus 2 by RT PCR NEGATIVE NEGATIVE Final    Comment: (NOTE) SARS-CoV-2 target nucleic acids are NOT DETECTED.  The SARS-CoV-2 RNA is generally detectable in upper respiratory specimens during the acute phase of infection. The lowest concentration of SARS-CoV-2 viral copies this assay can detect  is 138 copies/mL. A negative result does not preclude SARS-Cov-2 infection and should not be used as the sole basis for treatment or other patient management decisions. A negative result may occur with  improper specimen collection/handling, submission of specimen other than nasopharyngeal swab, presence of viral mutation(s) within the areas targeted by this assay, and inadequate number of viral copies(<138 copies/mL). A negative result must be combined with clinical observations, patient history, and epidemiological information. The expected result is Negative.  Fact Sheet for Patients:  EntrepreneurPulse.com.au  Fact Sheet for Healthcare Providers:  IncredibleEmployment.be  This test is no t yet approved or cleared by the [place] FDA and  has been authorized for detection and/or diagnosis of SARS-CoV-2 by FDA under an Emergency Use Authorization (EUA). This EUA will remain  in effect (meaning this test can be used) for the duration of the COVID-19 declaration under Section 564(b)(1) of the Act, 21 U.S.C.section 360bbb-3(b)(1), unless the authorization is terminated  or revoked sooner.       Influenza A by PCR NEGATIVE NEGATIVE Final   Influenza B by PCR NEGATIVE NEGATIVE Final    Comment: (NOTE) The Xpert Xpress SARS-CoV-2/FLU/RSV plus assay is intended as an aid in the diagnosis of influenza from Nasopharyngeal swab specimens and should not be used as a sole basis for treatment. Nasal washings and aspirates are unacceptable for Xpert Xpress SARS-CoV-2/FLU/RSV testing.  Fact Sheet for Patients: EntrepreneurPulse.com.au  Fact Sheet for Healthcare Providers: IncredibleEmployment.be  This test is not yet approved or cleared by the [place] FDA and has been authorized for detection and/or diagnosis of SARS-CoV-2 by FDA under an Emergency Use Authorization (EUA). This EUA will remain in effect  (meaning this test can be used) for the duration of the COVID-19 declaration under Section 564(b)(1) of the Act, 21 U.S.C. section 360bbb-3(b)(1), unless the authorization is terminated or revoked.  Performed at [facility], [address] [place], [place] [address]       Imaging Studies   CT ABDOMEN PELVIS W CONTRAST  Result Date: 01/07/2020 CLINICAL DATA:  Left CVA tenderness EXAM: CT ABDOMEN AND PELVIS WITH CONTRAST TECHNIQUE: Multidetector CT imaging of the abdomen and pelvis was performed using the standard protocol following bolus administration of intravenous contrast. CONTRAST:  91m OMNIPAQUE IOHEXOL 300 MG/ML  SOLN COMPARISON:  None. FINDINGS: Lower chest: Lung bases demonstrate dependent atelectasis. Cyst or bulla at the left lung base. No acute consolidation or effusion. Borderline to mild cardiomegaly. Hepatobiliary: Significant motion degradation. No focal hepatic abnormality, calcified gallstone or biliary dilatation. Pancreas: Unremarkable. No pancreatic ductal dilatation or surrounding inflammatory changes. Spleen: Normal in size without focal abnormality. Adrenals/Urinary Tract: Adrenal glands are normal. Multiple subcentimeter hypodense renal lesions too small to further characterize. Prominent left extrarenal pelvis without hydronephrosis. Thick-walled appearance of urinary bladder with mucosal enhancement. Possible hypoenhancement at the lower pole of the left kidney on the delayed views Stomach/Bowel: Stomach nonenlarged. No dilated small bowel. No bowel wall thickening. Negative appendix. Diverticular disease of the sigmoid colon without acute inflammatory process. Vascular/Lymphatic: Moderate aortic atherosclerosis. No aneurysm. No suspicious nodes Reproductive: Uterus and bilateral adnexa are unremarkable. Other: Negative for free air or free fluid. Musculoskeletal: Scoliosis and degenerative changes of the spine. No acute osseous abnormality IMPRESSION: 1.  Significant motion degradation which limits the exam. 2. Thick-walled appearance of the urinary bladder with mucosal enhancement, suspicious for cystitis. Possible hypoenhancement at the lower pole of the left kidney on the delayed views, question pyelonephritis. Negative for hydronephrosis. 3. Sigmoid colon diverticular disease without acute inflammatory process. Aortic Atherosclerosis (ICD10-I70.0). Electronically Signed   By: KDonavan FoilM.D.   On: 01/07/2020 21:59   DG Chest Portable 1 View  Result Date: 01/07/2020 CLINICAL DATA:  Tachypnea, fever EXAM: PORTABLE CHEST 1 VIEW COMPARISON:  None. FINDINGS: Heart is borderline in size. Mild peribronchial thickening and interstitial prominence throughout the lungs. Left basilar opacity, likely atelectasis. No effusions or acute bony abnormality. IMPRESSION: Peribronchial thickening and interstitial prominence of the lungs. Findings could reflect bronchitis either acute or chronic or atypical infection. Left base atelectasis. Electronically Signed   By: KRolm BaptiseM.D.   On: 01/07/2020 18:50     Medications   Scheduled Meds: . enoxaparin (LOVENOX) injection  30 mg Subcutaneous Q24H   Continuous Infusions: . cefTRIAXone (ROCEPHIN)  IV         LOS: 0 days    Time spent: 30 minutes with > 50% spent in coordination of care and direct patient contact.    KEzekiel Slocumb DO [facility]  01/08/2020, 10:06 AM    If 7PM-7AM, please contact night-coverage. How to contact the [facility]ending or Consulting provider [place] covering provider during after hours [place] for this patient?    1. Check the care team in [facility]and look for a) attending/consulting [facility] provider listed and b) the [facility] listed 2. Log into [email or web address] and use [place] universal password to access. If you do not have the password, please contact the hospital operator. 3. Locate the [facility] Centerprovider you are looking for under [facility] and page to  a number that you can be directly reached. 4. If you still have difficulty reaching the provider, please page the [facility](Director on Call) for the Hospitalists listed on amion for assistance.

## 2020-01-09 DIAGNOSIS — E441 Mild protein-calorie malnutrition: Secondary | ICD-10-CM | POA: Diagnosis not present

## 2020-01-09 DIAGNOSIS — F0150 Vascular dementia without behavioral disturbance: Secondary | ICD-10-CM | POA: Diagnosis not present

## 2020-01-09 DIAGNOSIS — A419 Sepsis, unspecified organism: Secondary | ICD-10-CM | POA: Diagnosis not present

## 2020-01-09 DIAGNOSIS — M8588 Other specified disorders of bone density and structure, other site: Secondary | ICD-10-CM

## 2020-01-09 LAB — BLOOD CULTURE ID PANEL (REFLEXED) - BCID2

## 2020-01-09 LAB — BASIC METABOLIC PANEL
Anion gap: 7 (ref 5–15)
BUN: 29 mg/dL — ABNORMAL HIGH (ref 8–23)
CO2: 24 mmol/L (ref 22–32)
Calcium: 7.8 mg/dL — ABNORMAL LOW (ref 8.9–10.3)
Chloride: 104 mmol/L (ref 98–111)
Creatinine, Ser: 1.04 mg/dL — ABNORMAL HIGH (ref 0.44–1.00)
GFR, Estimated: 53 mL/min — ABNORMAL LOW (ref >60)
Glucose, Bld: 91 mg/dL (ref 70–99)
Potassium: 4.2 mmol/L (ref 3.5–5.1)
Sodium: 135 mmol/L (ref 135–145)

## 2020-01-09 LAB — CBC
HCT: 32.3 % — ABNORMAL LOW (ref 36.0–46.0)
Hemoglobin: 10.7 g/dL — ABNORMAL LOW (ref 12.0–15.0)
MCH: 33.3 pg (ref 26.0–34.0)
MCHC: 33.1 g/dL (ref 30.0–36.0)
MCV: 100.6 fL — ABNORMAL HIGH (ref 80.0–100.0)
Platelets: 235 10*3/uL (ref 150–400)
RBC: 3.21 MIL/uL — ABNORMAL LOW (ref 3.87–5.11)
RDW: 13.7 % (ref 11.5–15.5)
WBC: 6.2 10*3/uL (ref 4.0–10.5)
nRBC: 0 % (ref 0.0–0.2)

## 2020-01-09 MED ORDER — SODIUM CHLORIDE 0.9 % IV SOLN
2.0000 g | Freq: Every day | INTRAVENOUS | Status: DC
  Administered 2020-01-09 – 2020-01-12 (×4): 2 g via INTRAVENOUS
  Filled 2020-01-09 (×2): qty 2
  Filled 2020-01-09 (×3): qty 20

## 2020-01-09 NOTE — Progress Notes (Signed)
PHARMACY - PHYSICIAN COMMUNICATION CRITICAL VALUE ALERT - BLOOD CULTURE IDENTIFICATION (BCID)  Christine Mccullough is an 85 y.o. female who presented to [facility] on 01/07/2020 with a chief complaint of fever, urinary frequency  Assessment: suspected UTI per symptoms and CT showing cystitis and pyelonephritis.  Blood culture with GNR and E. Coli as per BCID below.  Name of physician (or Provider) Contacted: Dr Denton Lank  Current antibiotics: ceftriaxone 1gm  Changes to prescribed antibiotics recommended:  Patient is on recommended antibiotics - No changes needed (ceftriaxone dose increased to 2gm q24h)  Results for orders placed or performed during the hospital encounter of 01/07/20  Blood Culture ID Panel (Reflexed) (Collected: 01/07/2020  5:09 PM)  Result Value Ref Range   Enterococcus faecalis NOT DETECTED NOT DETECTED   Enterococcus Faecium NOT DETECTED NOT DETECTED   Listeria monocytogenes NOT DETECTED NOT DETECTED   Staphylococcus species NOT DETECTED NOT DETECTED   Staphylococcus aureus (BCID) NOT DETECTED NOT DETECTED   Staphylococcus epidermidis NOT DETECTED NOT DETECTED   Staphylococcus lugdunensis NOT DETECTED NOT DETECTED   Streptococcus species NOT DETECTED NOT DETECTED   Streptococcus agalactiae NOT DETECTED NOT DETECTED   Streptococcus pneumoniae NOT DETECTED NOT DETECTED   Streptococcus pyogenes NOT DETECTED NOT DETECTED   A.calcoaceticus-baumannii NOT DETECTED NOT DETECTED   Bacteroides fragilis NOT DETECTED NOT DETECTED   Enterobacterales DETECTED (A) NOT DETECTED   Enterobacter cloacae complex NOT DETECTED NOT DETECTED   Escherichia coli DETECTED (A) NOT DETECTED   Klebsiella aerogenes NOT DETECTED NOT DETECTED   Klebsiella oxytoca NOT DETECTED NOT DETECTED   Klebsiella pneumoniae NOT DETECTED NOT DETECTED   Proteus species NOT DETECTED NOT DETECTED   Salmonella species NOT DETECTED NOT DETECTED   Serratia marcescens NOT DETECTED NOT DETECTED   Haemophilus  influenzae NOT DETECTED NOT DETECTED   Neisseria meningitidis NOT DETECTED NOT DETECTED   Pseudomonas aeruginosa NOT DETECTED NOT DETECTED   Stenotrophomonas maltophilia NOT DETECTED NOT DETECTED   Candida albicans NOT DETECTED NOT DETECTED   Candida auris NOT DETECTED NOT DETECTED   Candida glabrata NOT DETECTED NOT DETECTED   Candida krusei NOT DETECTED NOT DETECTED   Candida parapsilosis NOT DETECTED NOT DETECTED   Candida tropicalis NOT DETECTED NOT DETECTED   Cryptococcus neoformans/gattii NOT DETECTED NOT DETECTED   CTX-M ESBL NOT DETECTED NOT DETECTED   Carbapenem resistance IMP NOT DETECTED NOT DETECTED   Carbapenem resistance KPC NOT DETECTED NOT DETECTED   Carbapenem resistance NDM NOT DETECTED NOT DETECTED   Carbapenem resist OXA 48 LIKE NOT DETECTED NOT DETECTED   Carbapenem resistance VIM NOT DETECTED NOT DETECTED    Juliette Alcide, PharmD, BCPS.   Work Cell: [phone] 01/09/2020 9:40 AM

## 2020-01-09 NOTE — Progress Notes (Signed)
   01/08/20 2055  Assess: MEWS Score  Temp (!) 102.9 F (39.4 C)  Assess: MEWS Score  MEWS Temp 2  MEWS Systolic 0  MEWS Pulse 1  MEWS RR 0  MEWS LOC 0  MEWS Score 3  MEWS Score Color Yellow  Assess: if the MEWS score is Yellow or Red  Were vital signs taken at a resting state? Yes  Focused Assessment Change from prior assessment (see assessment flowsheet)  Early Detection of Sepsis Score *See Row Information* Low  MEWS guidelines implemented *See Row Information* No, vital signs rechecked  Treat  MEWS Interventions Administered prn meds/treatments (given tylenol)  Escalate  MEWS: Escalate Yellow: discuss with charge nurse/RN and consider discussing with provider and RRT  Notify: Charge Nurse/RN  Name of Charge Nurse/RN Notified Gayla Medicus, RN  Date Charge Nurse/RN Notified 01/08/20  Time Charge Nurse/RN Notified 2055  Notify: Provider  Provider Name/Title Manuela Schwartz NP  Document  Patient Outcome Stabilized after interventions

## 2020-01-09 NOTE — Progress Notes (Addendum)
PROGRESS NOTE    Christine Mccullough   ALP:[id number]  DOB: [birth year]  PCP: Adin Hector, MD    DOA: 01/07/2020 LOS: 1   Brief Narrative   Christine Mccullough is a 85 y.o. female with medical history significant for rheumatoid arthritis involving multiple sites with positive rheumatoid factor, osteoporosis, tobacco abuse, presented to the ED on 01/07/20 from [facility] for concerns of sepsis secondary to urinary tract infection when seen for complaint of urinary frequency and incontinence.    Patient met sepsis criteria on admission with fever, tachycardia, leukocytosis and UA consistent with infection.  Lactic acid was normal.  CT abdomen/pelvis showed findings of cystitis and possible left pyelonephritis as well.  Patient is on empiric Rocephin pending cultures.          Assessment & Plan   Principal Problem:   Sepsis secondary to UTI [facility]) Active Problems:   Tobacco use disorder   Vascular dementia ([place])   Mild protein-calorie malnutrition ([place])   Rheumatoid arthritis ([place])   Sepsis ([place])   Mass of hard palate   Sepsis secondary to E. Coli bacteremia secondary to UTI with left pyelonephritis- sepsis POA as outlined above.   Continue empiric Rocephin pending cultures.   Urine culture still pending, was not collected on admission. Blood cultures growing E. Coli, on 2g Rocephin. IV fluids, maintenance NS @ 75 cc/hr until eating & drinking well and tachycardia improves. Hold home ASA for now since UA showed hematuria.  Mild protein-calorie malnutrition - dietician consulted.  Continue Remeron for appetite. Patient BMI: Body mass index is 19.33 kg/m.   Tobacco use disorder - counseled on cessation  Vascular dementia - appears stable and at baseline per daughter.  Monitor. Continue Aricept.  Rheumatoid arthritis - takes methotrexate and folic acid - continue.  Mass of hard palate - chronic, no acute issues. Monitor.  (Image in physical exam below).     ?Depression/Anxiety - continue Remeron  Vitamin D deficiency - on supplement at home. Okay to hold for now.   DVT prophylaxis: enoxaparin (LOVENOX) injection 30 mg Start: 01/07/20 2200 Place TED hose Start: 01/07/20 1926   Diet:  Diet Orders (From admission, onward)    Start     Ordered   01/07/20 1927  Diet Heart Room service appropriate? Yes; Fluid consistency: Thin  Diet effective now       Question Answer Comment  Room service appropriate? Yes   Fluid consistency: Thin      01/07/20 1929            Code Status: DNR    Subjective 01/09/20    Pt seen with daughter at bedside.  Pt awake and reports she's been feeling pretty rough.  Had fever 102.9 last evening.  Denies fever/chills today.  Just feels tired.  Having urinary frequency but no dysuria.     Disposition Plan & Communication   Status is: Inpatient  Remains inpatient appropriate because:IV treatments appropriate due to intensity of illness or inability to take PO.  Awaiting blood culture susceptibilities and afebrile at least 24 hours prior to d/c.   Dispo: The patient is from: Home              Anticipated d/c is to: Home              Anticipated d/c date is: 2 days              Patient currently is not medically stable to d/c.    Family  Communication: daughter at bedside during rounds   Consults, Procedures, Significant Events   Consultants:   None  Procedures:   None  Antimicrobials:  Anti-infectives (From admission, onward)   Start     Dose/Rate Route Frequency Ordered Stop   01/09/20 1030  cefTRIAXone (ROCEPHIN) 2 g in sodium chloride 0.9 % 100 mL IVPB        2 g 200 mL/hr over 30 Minutes Intravenous Daily 01/09/20 0934     01/08/20 1800  cefTRIAXone (ROCEPHIN) 1 g in sodium chloride 0.9 % 100 mL IVPB  Status:  Discontinued        1 g 200 mL/hr over 30 Minutes Intravenous Every 24 hours 01/07/20 1929 01/09/20 0934   01/07/20 1730  cefTRIAXone (ROCEPHIN) 1 g in sodium chloride 0.9 %  100 mL IVPB        1 g 200 mL/hr over 30 Minutes Intravenous  Once 01/07/20 1722 01/07/20 1809        Objective   Vitals:   01/08/20 2232 01/09/20 0501 01/09/20 0744 01/09/20 1156  BP: 127/75 132/73 (!) 141/80 (!) 155/89  Pulse: 99 91 92 (!) 106  Resp: '15 18 20 18  ' Temp: 98.8 F (37.1 C) 98.1 F (36.7 C) 98.2 F (36.8 C) 98.6 F (37 C)  TempSrc: Oral Oral Oral Oral  SpO2: 98% 100% 98% 93%  Weight:      Height:        Intake/Output Summary (Last 24 hours) at 01/09/2020 1848 Last data filed at 01/09/2020 1846 Gross per 24 hour  Intake 2067.43 ml  Output 450 ml  Net 1617.43 ml   Filed Weights   01/07/20 1413 01/07/20 2146  Weight: 46.7 kg 49.5 kg    Physical Exam:  General exam: awake, resting in bed, no acute distress, frail Respiratory system: CTAB, normal respiratory effort. Cardiovascular system: normal S1/S2, RRR, no pedal edema.   Gastrointestinal system: soft, NT, ND, no suprapubic tenderness Extremities: moves all, no edema, no cyanosis  ENT: hard palate mass image:      Labs   Data Reviewed: I have personally reviewed following labs and imaging studies  CBC: Recent Labs  Lab 01/07/20 1416 01/08/20 0558 01/09/20 0418  WBC 22.5* 13.3* 6.2  HGB 12.4 11.6* 10.7*  HCT 38.2 36.5 32.3*  MCV 102.1* 104.0* 100.6*  PLT 338 278 414   Basic Metabolic Panel: Recent Labs  Lab 01/07/20 1416 01/08/20 0558 01/09/20 0418  NA 138 135 135  K 4.7 4.3 4.2  CL 102 100 104  CO2 '23 25 24  ' GLUCOSE 116* 84 91  BUN 31* 29* 29*  CREATININE 1.16* 1.02* 1.04*  CALCIUM 9.2 8.5* 7.8*   GFR: Estimated Creatinine Clearance: 32 mL/min (A) (by C-G formula based on SCr of 1.04 mg/dL (H)). Liver Function Tests: No results for input(s): AST, ALT, ALKPHOS, BILITOT, PROT, ALBUMIN in the last 168 hours. No results for input(s): LIPASE, AMYLASE in the last 168 hours. No results for input(s): AMMONIA in the last 168 hours. Coagulation Profile: Recent Labs  Lab  01/08/20 0558  INR 1.2   Cardiac Enzymes: No results for input(s): CKTOTAL, CKMB, CKMBINDEX, TROPONINI in the last 168 hours. BNP (last 3 results) No results for input(s): PROBNP in the last 8760 hours. HbA1C: No results for input(s): HGBA1C in the last 72 hours. CBG: No results for input(s): GLUCAP in the last 168 hours. Lipid Profile: No results for input(s): CHOL, HDL, LDLCALC, TRIG, CHOLHDL, LDLDIRECT in the last 72 hours. Thyroid Function  Tests: Recent Labs    01/07/20 1416  TSH 1.321   Anemia Panel: No results for input(s): VITAMINB12, FOLATE, FERRITIN, TIBC, IRON, RETICCTPCT in the last 72 hours. Sepsis Labs: Recent Labs  Lab 01/07/20 1700 01/08/20 0558  PROCALCITON  --  0.75  LATICACIDVEN 1.1  --     Recent Results (from the past 240 hour(s))  Blood Culture (routine x 2)     Status: None (Preliminary result)   Collection Time: 01/07/20  5:08 PM   Specimen: BLOOD  Result Value Ref Range Status   Specimen Description BLOOD RA  Final   Special Requests   Final    BOTTLES DRAWN AEROBIC AND ANAEROBIC Blood Culture adequate volume   Culture   Final    NO GROWTH 2 DAYS Performed at [facility], [address] [place], [place] [address]    Report Status PENDING  Incomplete  Blood Culture (routine x 2)     Status: None (Preliminary result)   Collection Time: 01/07/20  5:09 PM   Specimen: BLOOD  Result Value Ref Range Status   Specimen Description   Final    BLOOD LAC Performed at [facility], [address] [place], [place] [address]    Special Requests   Final    BOTTLES DRAWN AEROBIC AND ANAEROBIC Blood Culture results may not be optimal due to an inadequate volume of blood received in culture bottles Performed at [facility], [address], [place], [place] [address]    Culture  Setup Time   Final    GRAM NEGATIVE RODS ANAEROBIC BOTTLE ONLY CRITICAL RESULT CALLED TO, READ BACK BY AND VERIFIED WITH: Lesia Hausen D AT  [id number] ON 01/09/20 SNG Performed at [facility], [place] [address], [place],  [address]    Culture GRAM NEGATIVE RODS  Final   Report Status PENDING  Incomplete  Blood Culture ID Panel (Reflexed)     Status: Abnormal   Collection Time: 01/07/20  5:09 PM  Result Value Ref Range Status   Enterococcus faecalis NOT DETECTED NOT DETECTED Final   Enterococcus Faecium NOT DETECTED NOT DETECTED Final   Listeria monocytogenes NOT DETECTED NOT DETECTED Final   Staphylococcus species NOT DETECTED NOT DETECTED Final   Staphylococcus aureus (BCID) NOT DETECTED NOT DETECTED Final   Staphylococcus epidermidis NOT DETECTED NOT DETECTED Final   Staphylococcus lugdunensis NOT DETECTED NOT DETECTED Final   Streptococcus species NOT DETECTED NOT DETECTED Final   Streptococcus agalactiae NOT DETECTED NOT DETECTED Final   Streptococcus pneumoniae NOT DETECTED NOT DETECTED Final   Streptococcus pyogenes NOT DETECTED NOT DETECTED Final   A.calcoaceticus-baumannii NOT DETECTED NOT DETECTED Final   Bacteroides fragilis NOT DETECTED NOT DETECTED Final   Enterobacterales DETECTED (A) NOT DETECTED Final    Comment: Enterobacterales represent a large order of gram negative bacteria, not a single organism. CRITICAL RESULT CALLED TO, READ BACK BY AND VERIFIED WITH: LISA KLUTZ PHARM D AT 5284 ON 01/09/20 SNG    Enterobacter cloacae complex NOT DETECTED NOT DETECTED Final   Escherichia coli DETECTED (A) NOT DETECTED Final    Comment: CRITICAL RESULT CALLED TO, READ BACK BY AND VERIFIED WITH: LISA KLUTZ PHARM D AT 1324 ON 01/09/20 SNG    Klebsiella aerogenes NOT DETECTED NOT DETECTED Final   Klebsiella oxytoca NOT DETECTED NOT DETECTED Final   Klebsiella pneumoniae NOT DETECTED NOT DETECTED Final   Proteus species NOT DETECTED NOT DETECTED Final   Salmonella species NOT DETECTED NOT DETECTED Final  Serratia marcescens NOT DETECTED NOT DETECTED Final   Haemophilus influenzae NOT DETECTED NOT DETECTED  Final   Neisseria meningitidis NOT DETECTED NOT DETECTED Final   Pseudomonas aeruginosa NOT DETECTED NOT DETECTED Final   Stenotrophomonas maltophilia NOT DETECTED NOT DETECTED Final   Candida albicans NOT DETECTED NOT DETECTED Final   Candida auris NOT DETECTED NOT DETECTED Final   Candida glabrata NOT DETECTED NOT DETECTED Final   Candida krusei NOT DETECTED NOT DETECTED Final   Candida parapsilosis NOT DETECTED NOT DETECTED Final   Candida tropicalis NOT DETECTED NOT DETECTED Final   Cryptococcus neoformans/gattii NOT DETECTED NOT DETECTED Final   CTX-M ESBL NOT DETECTED NOT DETECTED Final   Carbapenem resistance IMP NOT DETECTED NOT DETECTED Final   Carbapenem resistance KPC NOT DETECTED NOT DETECTED Final   Carbapenem resistance NDM NOT DETECTED NOT DETECTED Final   Carbapenem resist OXA 48 LIKE NOT DETECTED NOT DETECTED Final   Carbapenem resistance VIM NOT DETECTED NOT DETECTED Final    Comment: Performed at [facility], [place] [place], [place] [address]  Resp Panel by RT-PCR (Flu A&B, Covid) Nasopharyngeal Swab     Status: None   Collection Time: 01/07/20  7:37 PM   Specimen: Nasopharyngeal Swab; Nasopharyngeal(NP) swabs in vial transport medium  Result Value Ref Range Status   SARS Coronavirus 2 by RT PCR NEGATIVE NEGATIVE Final    Comment: (NOTE) SARS-CoV-2 target nucleic acids are NOT DETECTED.  The SARS-CoV-2 RNA is generally detectable in upper respiratory specimens during the acute phase of infection. The lowest concentration of SARS-CoV-2 viral copies this assay can detect is 138 copies/mL. A negative result does not preclude SARS-Cov-2 infection and should not be used as the sole basis for treatment or other patient management decisions. A negative result may occur with  improper specimen collection/handling, submission of specimen other than nasopharyngeal swab, presence of viral mutation(s) within the areas targeted by this assay, and inadequate  number of viral copies(<138 copies/mL). A negative result must be combined with clinical observations, patient history, and epidemiological information. The expected result is Negative.  Fact Sheet for Patients:  EntrepreneurPulse.com.au  Fact Sheet for Healthcare Providers:  IncredibleEmployment.be  This test is no t yet approved or cleared by the [place] FDA and  has been authorized for detection and/or diagnosis of SARS-CoV-2 by FDA under an Emergency Use Authorization (EUA). This EUA will remain  in effect (meaning this test can be used) for the duration of the COVID-19 declaration under Section 564(b)(1) of the Act, 21 U.S.C.section 360bbb-3(b)(1), unless the authorization is terminated  or revoked sooner.       Influenza A by PCR NEGATIVE NEGATIVE Final   Influenza B by PCR NEGATIVE NEGATIVE Final    Comment: (NOTE) The Xpert Xpress SARS-CoV-2/FLU/RSV plus assay is intended as an aid in the diagnosis of influenza from Nasopharyngeal swab specimens and should not be used as a sole basis for treatment. Nasal washings and aspirates are unacceptable for Xpert Xpress SARS-CoV-2/FLU/RSV testing.  Fact Sheet for Patients: EntrepreneurPulse.com.au  Fact Sheet for Healthcare Providers: IncredibleEmployment.be  This test is not yet approved or cleared by the [place] FDA and has been authorized for detection and/or diagnosis of SARS-CoV-2 by FDA under an Emergency Use Authorization (EUA). This EUA will remain in effect (meaning this test can be used) for the duration of the COVID-19 declaration under Section 564(b)(1) of the Act, 21 U.S.C. section 360bbb-3(b)(1), unless the authorization is terminated or revoked.  Performed at [facility]  Lab, [place], [place] [address]       Imaging Studies   CT ABDOMEN PELVIS W CONTRAST  Result Date: 01/07/2020 CLINICAL DATA:   Left CVA tenderness EXAM: CT ABDOMEN AND PELVIS WITH CONTRAST TECHNIQUE: Multidetector CT imaging of the abdomen and pelvis was performed using the standard protocol following bolus administration of intravenous contrast. CONTRAST:  89m OMNIPAQUE IOHEXOL 300 MG/ML  SOLN COMPARISON:  None. FINDINGS: Lower chest: Lung bases demonstrate dependent atelectasis. Cyst or bulla at the left lung base. No acute consolidation or effusion. Borderline to mild cardiomegaly. Hepatobiliary: Significant motion degradation. No focal hepatic abnormality, calcified gallstone or biliary dilatation. Pancreas: Unremarkable. No pancreatic ductal dilatation or surrounding inflammatory changes. Spleen: Normal in size without focal abnormality. Adrenals/Urinary Tract: Adrenal glands are normal. Multiple subcentimeter hypodense renal lesions too small to further characterize. Prominent left extrarenal pelvis without hydronephrosis. Thick-walled appearance of urinary bladder with mucosal enhancement. Possible hypoenhancement at the lower pole of the left kidney on the delayed views Stomach/Bowel: Stomach nonenlarged. No dilated small bowel. No bowel wall thickening. Negative appendix. Diverticular disease of the sigmoid colon without acute inflammatory process. Vascular/Lymphatic: Moderate aortic atherosclerosis. No aneurysm. No suspicious nodes Reproductive: Uterus and bilateral adnexa are unremarkable. Other: Negative for free air or free fluid. Musculoskeletal: Scoliosis and degenerative changes of the spine. No acute osseous abnormality IMPRESSION: 1. Significant motion degradation which limits the exam. 2. Thick-walled appearance of the urinary bladder with mucosal enhancement, suspicious for cystitis. Possible hypoenhancement at the lower pole of the left kidney on the delayed views, question pyelonephritis. Negative for hydronephrosis. 3. Sigmoid colon diverticular disease without acute inflammatory process. Aortic Atherosclerosis  (ICD10-I70.0). Electronically Signed   By: KDonavan FoilM.D.   On: 01/07/2020 21:59     Medications   Scheduled Meds: . donepezil  10 mg Oral Daily  . enoxaparin (LOVENOX) injection  30 mg Subcutaneous Q24H  . folic acid  1 mg Oral Daily  . [START ON 01/14/2020] methotrexate  20 mg Oral Q Sun  . mirtazapine  7.5 mg Oral QHS   Continuous Infusions: . sodium chloride 75 mL/hr at 01/09/20 1846  . cefTRIAXone (ROCEPHIN)  IV Stopped (01/09/20 1111)       LOS: 1 day    Time spent: 25 minutes with > 50% spent in coordination of care and direct patient contact.    KEzekiel Slocumb DO [facility]  01/09/2020, 6:48 PM    If 7PM-7AM, please contact night-coverage. How to contact the TSouth[facility] CtAttending or Consulting provider [place] covering provider during after hours [place] for this patient?    1. Check the care team in [facility] Centerand look for a) attending/consulting TRH provider listed and b) the [facility]Al[facility] Centerteam listed 2. Log into [email or web address] and use [place]'s universal password to access. If you do not have the password, please contact the hospital operator. 3. Locate the TBaylor [facility] Center - Lake Pointeprovider you are looking for under [facility] and page to a number that you can be directly reached. 4. If you still have difficulty reaching the provider, please page the [facility](Director on Call) for the Hospitalists listed on amion for assistance.

## 2020-01-10 DIAGNOSIS — M8588 Other specified disorders of bone density and structure, other site: Secondary | ICD-10-CM | POA: Diagnosis not present

## 2020-01-10 DIAGNOSIS — A419 Sepsis, unspecified organism: Secondary | ICD-10-CM | POA: Diagnosis not present

## 2020-01-10 DIAGNOSIS — M069 Rheumatoid arthritis, unspecified: Secondary | ICD-10-CM | POA: Diagnosis not present

## 2020-01-10 DIAGNOSIS — R7881 Bacteremia: Secondary | ICD-10-CM

## 2020-01-10 DIAGNOSIS — B9620 Unspecified Escherichia coli [E. coli] as the cause of diseases classified elsewhere: Secondary | ICD-10-CM

## 2020-01-10 DIAGNOSIS — E441 Mild protein-calorie malnutrition: Secondary | ICD-10-CM | POA: Diagnosis not present

## 2020-01-10 LAB — CBC
HCT: 34.5 % — ABNORMAL LOW (ref 36.0–46.0)
Hemoglobin: 11.6 g/dL — ABNORMAL LOW (ref 12.0–15.0)
MCH: 33.3 pg (ref 26.0–34.0)
MCHC: 33.6 g/dL (ref 30.0–36.0)
MCV: 99.1 fL (ref 80.0–100.0)
Platelets: 244 10*3/uL (ref 150–400)
RBC: 3.48 MIL/uL — ABNORMAL LOW (ref 3.87–5.11)
RDW: 13.6 % (ref 11.5–15.5)
WBC: 6.5 10*3/uL (ref 4.0–10.5)
nRBC: 0 % (ref 0.0–0.2)

## 2020-01-10 LAB — URINE CULTURE: Culture: NO GROWTH

## 2020-01-10 MED ORDER — ACETAMINOPHEN 500 MG PO TABS
500.0000 mg | ORAL_TABLET | Freq: Four times a day (QID) | ORAL | Status: DC | PRN
  Administered 2020-01-10 – 2020-01-11 (×2): 500 mg via ORAL
  Filled 2020-01-10 (×2): qty 1

## 2020-01-10 NOTE — Progress Notes (Signed)
Secure chat to Dr. Tyson Babinski: Central tele just called, patient had a 12 beat run of SVT with rate up to 220. She is now back in sinus rhythm with rate of 87.

## 2020-01-10 NOTE — Evaluation (Signed)
Occupational Therapy Evaluation Patient Details Name: Christine Mccullough MRN: [id number] DOB: 09-17-1934 Today's Date: 01/10/2020    History of Present Illness Pt is an 84 y.o. female with medical history significant for rheumatoid arthritis involving multiple sites with positive rheumatoid factor, osteoporosis, tobacco abuse, presented to the emergency department from [facility] clinic for concerns of sepsis secondary to urinary tract infection.  MD assessment includes: Sepsis secondary to Christine Mccullough bacteremia secondary to UTI with left pyelonephritis, Mild protein-calorie malnutrition, Vascular dementia, and vit D deficiency.   Clinical Impression   Christine Mccullough was seen for OT evaluation this date. Prior to hospital admission, pt was living home alone, with her daughter available to assist PRN during the day and a PCA to stay with her at night. Per daughter who is present at bedside t/o session, pt generally requires supervision for safety during ADL management, family assists with IADL tasks including medication management, meal prep, etc. Per daughter, pt has recently begun using a 2WW for functional mobility since getting weaker over the past month. Pt endorses she is able to dress herself independently. Daughter assists pt in/out of the bathtub for bathing. Currently pt demonstrates impairments as described below (See OT problem list) which functionally limit her ability to perform ADL/self-care tasks. Pt currently requires MIN A for STS and close min guard for functional mobility within her hospital room on this date. Pt is able to perform seated UB ADL management with set-up/supervision.  Pt would benefit from skilled OT services to address noted impairments and functional limitations (see below for any additional details) in order to maximize safety and independence while minimizing falls risk and caregiver burden. Upon hospital discharge, recommend STR to maximize pt safety and return to PLOF.       Follow Up Recommendations  SNF    Equipment Recommendations   (Defer to next venue of care.)    Recommendations for Other Services       Precautions / Restrictions Precautions Precautions: Fall Restrictions Weight Bearing Restrictions: No      Mobility Bed Mobility Overal bed mobility: Needs Assistance Bed Mobility: Supine to Sit     Supine to sit: Min assist     General bed mobility comments: Deferred. Pt in recliner at start/end of session.    Transfers Overall transfer level: Needs assistance Equipment used: Rolling walker (2 wheeled) Transfers: Sit to/from Stand Sit to Stand: Min assist;Mod assist         General transfer comment: Pt required physical assist and multiple attempts to come to and remain in full upright standing    Balance Overall balance assessment: Needs assistance Sitting-balance support: Feet unsupported;No upper extremity supported Sitting balance-Leahy Scale: Fair Sitting balance - Comments: Steady static sitting, reaching within BOS.   Standing balance support: Bilateral upper extremity supported;During functional activity Standing balance-Leahy Scale: Fair                             ADL either performed or assessed with clinical judgement   ADL Overall ADL's : Needs assistance/impaired                                     Functional mobility during ADLs: Cueing for safety;Rolling walker;Min guard General ADL Comments: Pt functionally limited by generalized weakness, decreased awareness of deficits, and decreased safety awareness. She requires close Min guard for functional mobility. She is  able to reach B feet with supervision while seated in room recliner. Anticipate set-up/supervision for seated ADL management. MIN A for STS tasks to support safety/balance.     Vision Baseline Vision/History: Wears glasses;Cataracts (Had Bilat cataract sx earlier in 2021.) Wears Glasses: Reading only Patient Visual  Report: No change from baseline       Perception     Praxis      Pertinent Vitals/Pain Pain Assessment: No/denies pain     Hand Dominance Right   Extremity/Trunk Assessment Upper Extremity Assessment Upper Extremity Assessment: Generalized weakness (AROM WFL, grip decreased/generally weak. Dtr endorses hx of intermittent tremor in LUE from past stroke, not observed this date. No significant focal weakness appreciated.)   Lower Extremity Assessment Lower Extremity Assessment: Generalized weakness;Defer to PT evaluation       Communication Communication Communication: No difficulties   Cognition Arousal/Alertness: Awake/alert Behavior During Therapy: WFL for tasks assessed/performed Overall Cognitive Status: History of cognitive impairments - at baseline                                 General Comments: Pt generally oriented, however duaghter assists with providing details of home set-up/PLOF during evaluation.   General Comments       Exercises Total Joint Exercises Ankle Circles/Pumps: AROM;Strengthening;Both;10 reps Quad Sets: Strengthening;Both;10 reps Gluteal Sets: Strengthening;Both;10 reps Long Arc Quad: AROM;Strengthening;Both;10 reps Knee Flexion: AROM;Strengthening;Both;10 reps Marching in Standing: AROM;Strengthening;Both;10 reps;Seated Other Exercises Other Exercises: HEP education for BLE APs, QS, and GS x 10 each every 1-2 hours daily Other Exercises: Pt educated on role of OT in acute setting, safe use of AE/DME for ADL management, and routines modifications to support safety and functional independence upon hospital DC.   Shoulder Instructions      Home Living Family/patient expects to be discharged to:: Private residence Living Arrangements: Alone Available Help at Discharge: Family;Personal care attendant (PCA stays with pt at night, dtr stays during the day.) Type of Home: House Home Access: Stairs to enter [employer] of  Steps: 2 Entrance Stairs-Rails: None Home Layout: One level     Bathroom Shower/Tub: Tub/shower unit;Walk-in shower   Bathroom Toilet: Handicapped height     Home Equipment: Environmental consultant - 2 wheels;Walker - 4 wheels;Wheelchair - manual   Additional Comments: Has a PCA at night, daughter PRN during the day      Prior Functioning/Environment Level of Independence: Needs assistance  Gait / Transfers Assistance Needed: Mod Ind amb HH distances with a rollator and more recently with a RW due to weakness from current diagnosis, 1 fall in the last 6 months ADL's / Homemaking Assistance Needed: Ind with dressing, daughter assists with bathing primarily getting in/out of the tub. Family assist with IADL tasks including meal prep and medication management.            OT Problem List: Decreased strength;Decreased coordination;Decreased activity tolerance;Decreased safety awareness;Decreased knowledge of use of DME or AE;Impaired balance (sitting and/or standing);Decreased cognition      OT Treatment/Interventions: Self-care/ADL training;Therapeutic exercise;Therapeutic activities;DME and/or AE instruction;Patient/family education;Balance training;Energy conservation    OT Goals(Current goals can be found in the care plan section) Acute Rehab OT Goals Patient Stated Goal: "to go home, sleep in my own bed, and use my own bathroom" OT Goal Formulation: With patient/family Time For Goal Achievement: 01/24/20 Potential to Achieve Goals: Good ADL Goals Pt Will Perform Lower Body Dressing: sit to/from stand;with set-up;with supervision;with adaptive equipment (c LRAD  PRN for improved safety and functional indep.) Pt Will Transfer to Toilet: bedside commode;with set-up;with supervision;ambulating (c LRAD PRN for improved safety and functional indep.) Pt Will Perform Toileting - Clothing Manipulation and hygiene: with adaptive equipment;with supervision;with set-up;sit to/from stand (c LRAD PRN for  improved safety and functional indep.) Additional ADL Goal #1: Pt/caregiver will independently verbalize a plan to implement at least 3 learned falls prevention strategies into her daily routines/home environment for improved safety and functional independence upon hospital DC.  OT Frequency: Min 1X/week   Barriers to D/C: Inaccessible home environment          Co-evaluation              AM-PAC OT "6 Clicks" Daily Activity     Outcome Measure Help from another person eating meals?: A Little Help from another person taking care of personal grooming?: A Little Help from another person toileting, which includes using toliet, bedpan, or urinal?: A Little Help from another person bathing (including washing, rinsing, drying)?: A Lot Help from another person to put on and taking off regular upper body clothing?: A Little Help from another person to put on and taking off regular lower body clothing?: A Lot 6 Click Score: 16   End of Session Equipment Utilized During Treatment: Gait belt;Rolling walker  Activity Tolerance: Patient tolerated treatment well Patient left: in chair;with call bell/phone within reach;with chair alarm set;with family/visitor present  OT Visit Diagnosis: Other abnormalities of gait and mobility (R26.89);Muscle weakness (generalized) (M62.81)                Time: 6256-3893 OT Time Calculation (min): 23 min Charges:  OT General Charges $OT Visit: 1 Visit OT Evaluation $OT Eval Moderate Complexity: 1 Mod OT Treatments $Self Care/Home Management : 8-22 mins  Rockney Ghee, M.S., OTR/L Ascom: [phone] 01/10/20, 2:26 PM

## 2020-01-10 NOTE — Progress Notes (Signed)
PROGRESS NOTE  Christine Mccullough DHR:[id number] DOB: 06-03-1934 DOA: 01/07/2020 PCP: Adin Hector, MD   LOS: 2 days   Brief narrative: As per HPI,  Christine Mccullough a 85 y.o.femalewith past medical history significant forrheumatoid arthritis involving multiple sites with positive rheumatoid factor, osteoporosis, tobacco abuse, presented to the ED on 01/07/20 from [place] clinic for concerns of sepsis secondary to urinary tract infection.patient also complained of urinary frequency and incontinence.  Patient met sepsis criteria on presentation with fever, tachycardia leukocytosis and abnormal urinalysis.  Blood cultures were sent. CT scan of the abdomen showed cystitis and possible left pyelonephritis.  Patient was started on IV Rocephin and was admitted to the hospital.  Blood cultures showed E. coli bacteremia.  Assessment/Plan:  Principal Problem:   Sepsis secondary to UTI [facility]) Active Problems:   Tobacco use disorder   Vascular dementia ([place])   Mild protein-calorie malnutrition ([place])   Rheumatoid arthritis ([place])   Sepsis ([place])   Mass of hard palate   Sepsis secondary to E. Coli bacteremia likely secondary to UTI with left pyelonephritis-complicated UTI.  History of rheumatoid arthritis on methotrexate.  Patient presented with sepsis on presentation.  Continue IV Rocephin.  Urine culture showing none but blood culture from 11/21 positive for E. coli bacteremia.  Continue gentle IV fluids.  Aspirin on hold due to mild hematuria.  Patient is immune compromised we will continue IV antibiotics.  Sensitivities to E. coli still pending.  WBC at 6.2.  Procalcitonin 0.75.  Supraventricular tachycardia.  Reported by nursing staff this evening.  Currently in normal sinus rhythm.   Mild protein-calorie malnutrition -present on admission.  Nutrition on board.  On Remeron for appetite improvement.   Tobacco use disorder - counseled on cessation  Vascular dementia -on Aricept.  At  baseline.  Rheumatoid arthritis -chronically takes methotrexate and folic acid, continue while in the hospital  Mass of hard palate - chronic, no acute issues. Monitor as outpatient.  Depression/Anxiety - continue Remeron  Vitamin D deficiency - on supplement at home.  Will resume on discharge.  DVT prophylaxis: enoxaparin (LOVENOX) injection 30 mg Start: 01/07/20 2200 Place TED hose Start: 01/07/20 1926   Code Status: DNR  Family Communication: Spoke with the patient's daughter at bedside.  Status is: Inpatient  Remains inpatient appropriate because:IV treatments appropriate due to intensity of illness or inability to take PO and Inpatient level of care appropriate due to severity of illness IV antibiotics, follow sensitivity  Dispo: The patient is from: Home              Anticipated d/c is to: Home, will get PT evaluation.  At baseline, patient walks with the help of walker at home.              Anticipated d/c date is: 1 to 2 days.              Patient currently is not medically stable to d/c.  Consultants:  None  Procedures:  None  Antibiotics:  . Rocephin IV 11/21>  Anti-infectives (From admission, onward)   Start     Dose/Rate Route Frequency Ordered Stop   01/09/20 1030  cefTRIAXone (ROCEPHIN) 2 g in sodium chloride 0.9 % 100 mL IVPB        2 g 200 mL/hr over 30 Minutes Intravenous Daily 01/09/20 0934     01/08/20 1800  cefTRIAXone (ROCEPHIN) 1 g in sodium chloride 0.9 % 100 mL IVPB  Status:  Discontinued  1 g 200 mL/hr over 30 Minutes Intravenous Every 24 hours 01/07/20 1929 01/09/20 0934   01/07/20 1730  cefTRIAXone (ROCEPHIN) 1 g in sodium chloride 0.9 % 100 mL IVPB        1 g 200 mL/hr over 30 Minutes Intravenous  Once 01/07/20 1722 01/07/20 1809     Subjective: Today, patient was seen and examined at bedside.  Patient's daughter at bedside.  Patient denies any nausea vomiting abdominal pain fever or chills.  Wishes to go home soon.  Complains  of salivary drooling and frequency but no dysuria or urgency.  Objective: Vitals:   01/10/20 0420 01/10/20 0743  BP: 135/88 (!) 154/91  Pulse: 99 91  Resp: (!) 22 (!) 21  Temp: (!) 97.4 F (36.3 C) 98.4 F (36.9 C)  SpO2: 95% 96%    Intake/Output Summary (Last 24 hours) at 01/10/2020 0829 Last data filed at 01/10/2020 0504 Gross per 24 hour  Intake 1042.39 ml  Output 450 ml  Net 592.39 ml   Filed Weights   01/07/20 1413 01/07/20 2146  Weight: 46.7 kg 49.5 kg   Body mass index is 19.33 kg/m.   Physical Exam:  GENERAL: Patient is alert awake and communicative,. Not in obvious distress, thinly built HENT: No scleral pallor or icterus. Pupils equally reactive to light. Oral mucosa is moist NECK: is supple, no gross swelling noted. CHEST: Clear to auscultation. No crackles or wheezes.  Diminished breath sounds bilaterally. CVS: S1 and S2 heard, no murmur. Regular rate and rhythm.  ABDOMEN: Soft, non-tender, bowel sounds are present. EXTREMITIES: No edema. CNS: Cranial nerves are intact. No focal motor deficits. SKIN: warm and dry without rashes.  Data Review: I have personally reviewed the following laboratory data and studies,  CBC: Recent Labs  Lab 01/07/20 1416 01/08/20 0558 01/09/20 0418 01/10/20 0635  WBC 22.5* 13.3* 6.2 6.5  HGB 12.4 11.6* 10.7* 11.6*  HCT 38.2 36.5 32.3* 34.5*  MCV 102.1* 104.0* 100.6* 99.1  PLT 338 278 235 101   Basic Metabolic Panel: Recent Labs  Lab 01/07/20 1416 01/08/20 0558 01/09/20 0418  NA 138 135 135  K 4.7 4.3 4.2  CL 102 100 104  CO2 '23 25 24  ' GLUCOSE 116* 84 91  BUN 31* 29* 29*  CREATININE 1.16* 1.02* 1.04*  CALCIUM 9.2 8.5* 7.8*   Liver Function Tests: No results for input(s): AST, ALT, ALKPHOS, BILITOT, PROT, ALBUMIN in the last 168 hours. No results for input(s): LIPASE, AMYLASE in the last 168 hours. No results for input(s): AMMONIA in the last 168 hours. Cardiac Enzymes: No results for input(s): CKTOTAL,  CKMB, CKMBINDEX, TROPONINI in the last 168 hours. BNP (last 3 results) No results for input(s): BNP in the last 8760 hours.  ProBNP (last 3 results) No results for input(s): PROBNP in the last 8760 hours.  CBG: No results for input(s): GLUCAP in the last 168 hours. Recent Results (from the past 240 hour(s))  Blood Culture (routine x 2)     Status: None (Preliminary result)   Collection Time: 01/07/20  5:08 PM   Specimen: BLOOD  Result Value Ref Range Status   Specimen Description BLOOD RA  Final   Special Requests   Final    BOTTLES DRAWN AEROBIC AND ANAEROBIC Blood Culture adequate volume   Culture   Final    NO GROWTH 3 DAYS Performed at [facility], [address], [place], [place] [address]    Report Status PENDING  Incomplete  Blood Culture (routine  x 2)     Status: Abnormal (Preliminary result)   Collection Time: 01/07/20  5:09 PM   Specimen: BLOOD  Result Value Ref Range Status   Specimen Description   Final    BLOOD LAC Performed at [facility], [address] [place], [place] [address]    Special Requests   Final    BOTTLES DRAWN AEROBIC AND ANAEROBIC Blood Culture results may not be optimal due to an inadequate volume of blood received in culture bottles Performed at [facility], [address], [place], [place] [address]    Culture  Setup Time   Final    GRAM NEGATIVE RODS ANAEROBIC BOTTLE ONLY CRITICAL RESULT CALLED TO, READ BACK BY AND VERIFIED WITH: LISA KLUTZ PHARM D AT 0931 ON 01/09/20 SNG    Culture (A)  Final    ESCHERICHIA COLI SUSCEPTIBILITIES TO FOLLOW Performed at [facility], [place] [address], [place], [place] [address]    Report Status PENDING  Incomplete  Blood Culture ID Panel (Reflexed)     Status: Abnormal   Collection Time: 01/07/20  5:09 PM  Result Value Ref Range Status   Enterococcus faecalis NOT DETECTED NOT DETECTED Final   Enterococcus Faecium NOT DETECTED NOT DETECTED Final   Listeria  monocytogenes NOT DETECTED NOT DETECTED Final   Staphylococcus species NOT DETECTED NOT DETECTED Final   Staphylococcus aureus (BCID) NOT DETECTED NOT DETECTED Final   Staphylococcus epidermidis NOT DETECTED NOT DETECTED Final   Staphylococcus lugdunensis NOT DETECTED NOT DETECTED Final   Streptococcus species NOT DETECTED NOT DETECTED Final   Streptococcus agalactiae NOT DETECTED NOT DETECTED Final   Streptococcus pneumoniae NOT DETECTED NOT DETECTED Final   Streptococcus pyogenes NOT DETECTED NOT DETECTED Final   A.calcoaceticus-baumannii NOT DETECTED NOT DETECTED Final   Bacteroides fragilis NOT DETECTED NOT DETECTED Final   Enterobacterales DETECTED (A) NOT DETECTED Final    Comment: Enterobacterales represent a large order of gram negative bacteria, not a single organism. CRITICAL RESULT CALLED TO, READ BACK BY AND VERIFIED WITH: LISA KLUTZ PHARM D AT 0354 ON 01/09/20 SNG    Enterobacter cloacae complex NOT DETECTED NOT DETECTED Final   Escherichia coli DETECTED (A) NOT DETECTED Final    Comment: CRITICAL RESULT CALLED TO, READ BACK BY AND VERIFIED WITH: LISA KLUTZ PHARM D AT 0928 ON 01/09/20 SNG    Klebsiella aerogenes NOT DETECTED NOT DETECTED Final   Klebsiella oxytoca NOT DETECTED NOT DETECTED Final   Klebsiella pneumoniae NOT DETECTED NOT DETECTED Final   Proteus species NOT DETECTED NOT DETECTED Final   Salmonella species NOT DETECTED NOT DETECTED Final   Serratia marcescens NOT DETECTED NOT DETECTED Final   Haemophilus influenzae NOT DETECTED NOT DETECTED Final   Neisseria meningitidis NOT DETECTED NOT DETECTED Final   Pseudomonas aeruginosa NOT DETECTED NOT DETECTED Final   Stenotrophomonas maltophilia NOT DETECTED NOT DETECTED Final   Candida albicans NOT DETECTED NOT DETECTED Final   Candida auris NOT DETECTED NOT DETECTED Final   Candida glabrata NOT DETECTED NOT DETECTED Final   Candida krusei NOT DETECTED NOT DETECTED Final   Candida parapsilosis NOT DETECTED NOT  DETECTED Final   Candida tropicalis NOT DETECTED NOT DETECTED Final   Cryptococcus neoformans/gattii NOT DETECTED NOT DETECTED Final   CTX-M ESBL NOT DETECTED NOT DETECTED Final   Carbapenem resistance IMP NOT DETECTED NOT DETECTED Final   Carbapenem resistance KPC NOT DETECTED NOT DETECTED Final   Carbapenem resistance NDM NOT DETECTED NOT DETECTED Final   Carbapenem  resist OXA 48 LIKE NOT DETECTED NOT DETECTED Final   Carbapenem resistance VIM NOT DETECTED NOT DETECTED Final    Comment: Performed at [facility], [address] [place], [place] [address]  Resp Panel by RT-PCR (Flu A&B, Covid) Nasopharyngeal Swab     Status: None   Collection Time: 01/07/20  7:37 PM   Specimen: Nasopharyngeal Swab; Nasopharyngeal(NP) swabs in vial transport medium  Result Value Ref [place] Status   SARS Coronavirus 2 by RT PCR NEGATIVE NEGATIVE Final    Comment: (NOTE) SARS-CoV-2 target nucleic acids are NOT DETECTED.  The SARS-CoV-2 RNA is generally detectable in upper respiratory specimens during the acute phase of infection. The lowest concentration of SARS-CoV-2 viral copies this assay can detect is 138 copies/mL. A negative result does not preclude SARS-Cov-2 infection and should not be used as the sole basis for treatment or other patient management decisions. A negative result may occur with  improper specimen collection/handling, submission of specimen other than nasopharyngeal swab, presence of viral mutation(s) within the areas targeted by this assay, and inadequate number of viral copies(<138 copies/mL). A negative result must be combined with clinical observations, patient history, and epidemiological information. The expected result is Negative.  Fact Sheet for Patients:  EntrepreneurPulse.com.au  Fact Sheet for Healthcare Providers:  IncredibleEmployment.be  This test is no t yet approved or cleared by the [place] FDA and  has been  authorized for detection and/or diagnosis of SARS-CoV-2 by FDA under an Emergency Use Authorization (EUA). This EUA will remain  in effect (meaning this test can be used) for the duration of the COVID-19 declaration under Section 564(b)(1) of the Act, 21 U.S.C.section 360bbb-3(b)(1), unless the authorization is terminated  or revoked sooner.       Influenza A by PCR NEGATIVE NEGATIVE Final   Influenza B by PCR NEGATIVE NEGATIVE Final    Comment: (NOTE) The Xpert Xpress SARS-CoV-2/FLU/RSV plus assay is intended as an aid in the diagnosis of influenza from Nasopharyngeal swab specimens and should not be used as a sole basis for treatment. Nasal washings and aspirates are unacceptable for Xpert Xpress SARS-CoV-2/FLU/RSV testing.  Fact Sheet for Patients: EntrepreneurPulse.com.au  Fact Sheet for Healthcare Providers: IncredibleEmployment.be  This test is not yet approved or cleared by the [place] FDA and has been authorized for detection and/or diagnosis of SARS-CoV-2 by FDA under an Emergency Use Authorization (EUA). This EUA will remain in effect (meaning this test can be used) for the duration of the COVID-19 declaration under Section 564(b)(1) of the Act, 21 U.S.C. section 360bbb-3(b)(1), unless the authorization is terminated or revoked.  Performed at [facility], [address] [place], [place] [address]   Urine Culture     Status: None   Collection Time: 01/09/20  9:26 AM   Specimen: Urine, Random  Result Value Ref [place] Status   Specimen Description   Final    URINE, RANDOM Performed at [facility], [address] [place], [place] [address]    Special Requests   Final    NONE Performed at [facility], [address] [place], [place] [address]    Culture   Final    NO GROWTH Performed at [facility], [place] [address], [place],  [address]    Report Status 01/10/2020 FINAL   Final     Studies: No results found.    Flora Lipps, MD  [facility] 01/10/2020

## 2020-01-10 NOTE — Evaluation (Signed)
Physical Therapy Evaluation Patient Details Name: Christine Mccullough MRN: [id number] DOB: [birth year] Today's Date: 01/10/2020   History of Present Illness  Pt is an 85 y.o. female with medical history significant for rheumatoid arthritis involving multiple sites with positive rheumatoid factor, osteoporosis, tobacco abuse, presented to the emergency department from [facility] for concerns of sepsis secondary to urinary tract infection.  MD assessment includes: Sepsis secondary to E. Coli bacteremia secondary to UTI with left pyelonephritis, Mild protein-calorie malnutrition, Vascular dementia, and vit D deficiency.    Clinical Impression  Pt was pleasant and motivated to participate during the session.  Pt put forth good effort during the session but was functionally weak requiring physical assistance to come to sitting at the EOB as well as to stand.  Pt required two attempts with min-mod A to stand with the first attempt resulting in pt making it half way to standing but unable to complete the transfer and returning to sitting.  Once pt was in standing pt was able to do some marching in place and then to amb 10' in the room with a slow, effortful cadence.  Pt will benefit from PT services in a SNF setting upon discharge to safely address deficits listed in patient problem list for decreased caregiver assistance and eventual return to PLOF.      Follow Up Recommendations SNF;Supervision for mobility/OOB    Equipment Recommendations  None recommended by PT    Recommendations for Other Services       Precautions / Restrictions Precautions Precautions: Fall Restrictions Weight Bearing Restrictions: No      Mobility  Bed Mobility Overal bed mobility: Needs Assistance Bed Mobility: Supine to Sit     Supine to sit: Min assist     General bed mobility comments: Min A to come to full upright sitting    Transfers Overall transfer level: Needs assistance Equipment used: Rolling  walker (2 wheeled) Transfers: Sit to/from Stand Sit to Stand: Min assist;Mod assist;From elevated surface         General transfer comment: Pt required physical assist and multiple attempts to come to and remain in full upright standing  Ambulation/Gait Ambulation/Gait assistance: Min guard Gait Distance (Feet): 10 Feet Assistive device: Rolling walker (2 wheeled) Gait Pattern/deviations: Step-through pattern;Decreased step length - right;Decreased step length - left;Trunk flexed Gait velocity: decreased   General Gait Details: Slow, effortful cadence with poor foot clearance at times  Stairs            Wheelchair Mobility    Modified Rankin (Stroke Patients Only)       Balance Overall balance assessment: Needs assistance   Sitting balance-Leahy Scale: Fair     Standing balance support: Bilateral upper extremity supported;During functional activity Standing balance-Leahy Scale: Fair                               Pertinent Vitals/Pain Pain Assessment: No/denies pain    Home Living Family/patient expects to be discharged to:: Private residence Living Arrangements: Alone Available Help at Discharge: Family;Personal care attendant Type of Home: House Home Access: Stairs to enter Entrance Stairs-Rails: None Entrance Stairs-Number of Steps: 2 Home Layout: One level Home Equipment: Environmental consultant - 2 wheels;Walker - 4 wheels;Wheelchair - manual Additional Comments: Has a PCA at night, daughter PRN during the day    Prior Function Level of Independence: Needs assistance   Gait / Transfers Assistance Needed: Mod Ind amb HH distances with a rollator  and more recently with a RW due to weakness from current diagnosis, 1 fall in the last 6 months  ADL's / Homemaking Assistance Needed: Ind with dressing, daughter assists with bathing        Hand Dominance        Extremity/Trunk Assessment   Upper Extremity Assessment Upper Extremity Assessment:  Generalized weakness    Lower Extremity Assessment Lower Extremity Assessment: Generalized weakness       Communication   Communication: No difficulties  Cognition Arousal/Alertness: Awake/alert Behavior During Therapy: WFL for tasks assessed/performed Overall Cognitive Status: History of cognitive impairments - at baseline                                 General Comments: Daughter assisted with details during history taking      General Comments      Exercises Total Joint Exercises Ankle Circles/Pumps: AROM;Strengthening;Both;10 reps Quad Sets: Strengthening;Both;10 reps Gluteal Sets: Strengthening;Both;10 reps Long Arc Quad: AROM;Strengthening;Both;10 reps Knee Flexion: AROM;Strengthening;Both;10 reps Marching in Standing: AROM;Strengthening;Both;10 reps;Seated x 10 and standing x 10 Other Exercises Other Exercises: HEP education for BLE APs, QS, and GS x 10 each every 1-2 hours daily   Assessment/Plan    PT Assessment Patient needs continued PT services  PT Problem List Decreased strength;Decreased activity tolerance;Decreased balance;Decreased mobility;Decreased knowledge of use of DME       PT Treatment Interventions DME instruction;Gait training;Stair training;Functional mobility training;Therapeutic activities;Therapeutic exercise;Balance training;Patient/family education    PT Goals (Current goals can be found in the Care Plan section)  Acute Rehab PT Goals Patient Stated Goal: To get back home PT Goal Formulation: With patient Time For Goal Achievement: 01/23/20 Potential to Achieve Goals: Good    Frequency Min 2X/week   Barriers to discharge Inaccessible home environment      Co-evaluation               AM-PAC PT "6 Clicks" Mobility  Outcome Measure Help needed turning from your back to your side while in a flat bed without using bedrails?: A Little Help needed moving from lying on your back to sitting on the side of a flat bed  without using bedrails?: A Little Help needed moving to and from a bed to a chair (including a wheelchair)?: A Lot Help needed standing up from a chair using your arms (e.g., wheelchair or bedside chair)?: A Lot Help needed to walk in hospital room?: A Little Help needed climbing 3-5 steps with a railing? : Total 6 Click Score: 14    End of Session Equipment Utilized During Treatment: Gait belt Activity Tolerance: Patient tolerated treatment well Patient left: in chair;with chair alarm set;with call bell/phone within reach;with family/visitor present Nurse Communication: Mobility status PT Visit Diagnosis: Unsteadiness on feet (R26.81);Muscle weakness (generalized) (M62.81);Difficulty in walking, not elsewhere classified (R26.2)    Time: 6270-3500 PT Time Calculation (min) (ACUTE ONLY): 32 min   Charges:   PT Evaluation $PT Eval Moderate Complexity: 1 Mod PT Treatments $Therapeutic Exercise: 8-22 mins        D. Elly Modena PT, DPT 01/10/20, 1:56 PM

## 2020-01-10 NOTE — Clinical Social Work Note (Signed)
PT recommending SNF. Left voicemail for daughter. Will discuss when she calls back.  Charlynn Court, CSW [phone]

## 2020-01-10 NOTE — NC FL2 (Signed)
[place] MEDICAID FL2 LEVEL OF CARE SCREENING TOOL     IDENTIFICATION  Patient Name: Christine Mccullough Birthdate: 1934-11-30 Sex: female Admission Date (Current Location): 01/07/2020  [place] and [place] Number:  Chiropodist and Address:  [facility], [address] [place], [place] [address]      Provider Number: [id number]  Attending Physician Name and Address:  Joycelyn Das, MD  Relative Name and Phone Number:       Current Level of Care: Hospital Recommended Level of Care: Skilled Nursing Facility Prior Approval Number:    Date Approved/Denied:   PASRR Number: [id number]  Discharge Plan: SNF    Current Diagnoses: Patient Active Problem List   Diagnosis Date Noted  . Sepsis (HCC) 01/07/2020  . Sepsis secondary to UTI (HCC) 01/07/2020  . Mass of hard palate 01/07/2020  . Dysarthria 12/29/2018  . Tobacco use disorder 12/29/2018  . Vascular dementia (HCC) 12/29/2018  . Age-related osteoporosis without current pathological fracture 12/29/2018  . Acute ischemic stroke (HCC) 12/28/2018  . Mild protein-calorie malnutrition (HCC) 12/27/2017  . Vitamin D deficiency 12/20/2014  . Macular degeneration 05/24/2013  . Rheumatoid arthritis (HCC) 05/03/2013    Orientation RESPIRATION BLADDER Height & Weight     Self, Situation, Place  Normal Incontinent, External catheter Weight: 109 lb 2 oz (49.5 kg) Height:  5\' 3"  (160 cm)  BEHAVIORAL SYMPTOMS/MOOD NEUROLOGICAL BOWEL NUTRITION STATUS   (None)  (Vascular Dementia) Incontinent Diet (Heart healthy)  AMBULATORY STATUS COMMUNICATION OF NEEDS Skin   Limited Assist Verbally Normal                       Personal Care Assistance Level of Assistance  Bathing, Feeding, Dressing Bathing Assistance: Limited assistance Feeding assistance: Limited assistance Dressing Assistance: Limited assistance     Functional Limitations Info  Sight, Hearing, Speech Sight Info: Adequate Hearing  Info: Adequate Speech Info: Adequate    SPECIAL CARE FACTORS FREQUENCY  PT (By licensed PT), OT (By licensed OT)     PT Frequency: 5 x week OT Frequency: 5 x week            Contractures Contractures Info: Not present    Additional Factors Info  Code Status, Allergies Code Status Info: DNR Allergies Info: Atorvastatin, Codeine, Propoxyphene, Pneumococcal Polysaccharide Vaccine           Current Medications (01/10/2020):  This is the current hospital active medication list Current Facility-Administered Medications  Medication Dose Route Frequency Provider Last Rate Last Admin  . cefTRIAXone (ROCEPHIN) 2 g in sodium chloride 0.9 % 100 mL IVPB  2 g Intravenous Daily 01/12/2020, RPH 200 mL/hr at 01/10/20 0935 2 g at 01/10/20 0935  . donepezil (ARICEPT) tablet 10 mg  10 mg Oral Daily 01/12/20 A, DO   10 mg at 01/10/20 01/12/20  . enoxaparin (LOVENOX) injection 30 mg  30 mg Subcutaneous Q24H Cox, Amy N, DO   30 mg at 01/09/20 2010  . folic acid (FOLVITE) tablet 1 mg  1 mg Oral Daily 2011 A, DO   1 mg at 01/10/20 01/12/20  . [START ON 01/14/2020] methotrexate (RHEUMATREX) tablet 20 mg  20 mg Oral Q Sun Griffith, Kelly A, DO      . metoprolol tartrate (LOPRESSOR) injection 2.5 mg  2.5 mg Intravenous Q5 min PRN Cox, Amy N, DO   2.5 mg at 01/07/20 2017  . mirtazapine (REMERON) tablet 7.5 mg  7.5 mg Oral QHS 2018  A, DO   7.5 mg at 01/09/20 2010     Discharge Medications: Please see discharge summary for a list of discharge medications.  Relevant Imaging Results:  Relevant Lab Results:   Additional Information SS#: [id number]  Margarito Liner, LCSW

## 2020-01-10 NOTE — Care Management Important Message (Signed)
Important Message  Patient Details  Name: Christine Mccullough MRN: [id number] Date of Birth: 1934/12/09   Medicare Important Message Given:  Yes     Johnell Comings 01/10/2020, 11:32 AM

## 2020-01-10 NOTE — TOC Initial Note (Signed)
Transition of Care [facility]) - Initial/Assessment Note    Patient Details  Name: Christine Mccullough MRN: [id number] Date of Birth: 07-20-1934  Transition of [facility]) CM/SW Contact:    Margarito Liner, LCSW Phone Number: 01/10/2020, 3:14 PM  Clinical Narrative: Patient has vascular dementia. Not fully oriented and memory impairment. Daughter at bedside. CSW introduced role and explained that PT recommendations would be discussed. Daughter is agreeable to SNF placement although patient is not. They will discuss further before making decision. CSW provided CMS scores for SNF's within 25 miles of her zip code and home health agencies that serve her zip code. CSW will send out referral to see what her options are. No further concerns. CSW encouraged patient to contact CSW as needed. CSW will continue to follow patient and her daughter for support and facilitate discharge to SNF vs. Home once medically stable. Patient has received both Moderna vaccines.               Expected Discharge Plan: Skilled Nursing Facility Barriers to Discharge: Continued Medical Work up   Patient Goals and CMS Choice Patient states their goals for this hospitalization and ongoing recovery are:: Patient not fully oriented. CMS Medicare.gov Compare Post Acute Care list provided to:: Patient Represenative (must comment) (Daughter)    Expected Discharge Plan and Services Expected Discharge Plan: Skilled Nursing Facility       Living arrangements for the past 2 months: Single Family Home                                      Prior Living Arrangements/Services Living arrangements for the past 2 months: Single Family Home Lives with:: Self Patient language and need for interpreter reviewed:: Yes Do you feel safe going back to the place where you live?: Yes      Need for Family Participation in Patient Care: Yes (Comment) Care giver support system in place?: Yes (comment)   Criminal Activity/Legal Involvement  Pertinent to Current Situation/Hospitalization: No - Comment as needed  Activities of Daily Living Home Assistive Devices/Equipment: Wheelchair ADL Screening (condition at time of admission) Patient's cognitive ability adequate to safely complete daily activities?: Yes Is the patient deaf or have difficulty hearing?: No Does the patient have difficulty seeing, even when wearing glasses/contacts?: No Does the patient have difficulty concentrating, remembering, or making decisions?: No Patient able to express need for assistance with ADLs?: Yes Does the patient have difficulty dressing or bathing?: Yes Independently performs ADLs?: No Communication: Independent Dressing (OT): Needs assistance Is this a change from baseline?: Pre-admission baseline Grooming: Needs assistance Is this a change from baseline?: Pre-admission baseline Feeding: Independent Bathing: Needs assistance Is this a change from baseline?: Pre-admission baseline Toileting: Needs assistance Is this a change from baseline?: Pre-admission baseline In/Out Bed: Needs assistance Is this a change from baseline?: Pre-admission baseline Walks in Home: Needs assistance Is this a change from baseline?: Pre-admission baseline Does the patient have difficulty walking or climbing stairs?: Yes Weakness of Legs: Both Weakness of Arms/Hands: Both  Permission Sought/Granted Permission sought to share information with : Facility Medical sales representative, Family Supports    Share Information with NAME: Melvenia Beam  Permission granted to share info w AGENCY: SNF's  Permission granted to share info w Relationship: Daughter  Permission granted to share info w Contact Information: [phone]  Emotional Assessment Appearance:: Appears stated age Attitude/Demeanor/Rapport: Unable to Assess Affect (typically observed): Unable to  Assess Orientation: : Oriented to Self, Oriented to Place, Oriented to Situation Alcohol / Substance  Use: Not Applicable Psych Involvement: No (comment)  Admission diagnosis:  Lower urinary tract infectious disease [N39.0] Weakness [R53.1] Sepsis (HCC) [A41.9] Sepsis secondary to UTI (HCC) [A41.9, N39.0] Patient Active Problem List   Diagnosis Date Noted  . Sepsis (HCC) 01/07/2020  . Sepsis secondary to UTI (HCC) 01/07/2020  . Mass of hard palate 01/07/2020  . Dysarthria 12/29/2018  . Tobacco use disorder 12/29/2018  . Vascular dementia (HCC) 12/29/2018  . Age-related osteoporosis without current pathological fracture 12/29/2018  . Acute ischemic stroke (HCC) 12/28/2018  . Mild protein-calorie malnutrition (HCC) 12/27/2017  . Vitamin D deficiency 12/20/2014  . Macular degeneration 05/24/2013  . Rheumatoid arthritis (HCC) 05/03/2013   PCP:  Lynnea Ferrier, MD Pharmacy:   [facility] - [place], [place] - [address] [place] [address] Phone: [phone] Fax: [phone]     Social Determinants of Health (SDOH) Interventions    Readmission Risk Interventions No flowsheet data found.

## 2020-01-11 DIAGNOSIS — A419 Sepsis, unspecified organism: Secondary | ICD-10-CM | POA: Diagnosis not present

## 2020-01-11 DIAGNOSIS — E441 Mild protein-calorie malnutrition: Secondary | ICD-10-CM | POA: Diagnosis not present

## 2020-01-11 DIAGNOSIS — M8588 Other specified disorders of bone density and structure, other site: Secondary | ICD-10-CM | POA: Diagnosis not present

## 2020-01-11 DIAGNOSIS — M069 Rheumatoid arthritis, unspecified: Secondary | ICD-10-CM | POA: Diagnosis not present

## 2020-01-11 LAB — CULTURE, BLOOD (ROUTINE X 2)

## 2020-01-11 NOTE — Progress Notes (Signed)
PROGRESS NOTE  Christine Mccullough RCB:[id number] DOB: 02-22-1934 DOA: 01/07/2020 PCP: Adin Hector, MD   LOS: 3 days   Brief narrative: As per HPI,  Christine Mccullough a 85 y.o.femalewith past medical history significant forrheumatoid arthritis involving multiple sites with positive rheumatoid factor, osteoporosis, tobacco abuse, presented to the ED on 01/07/20 from [facility] for concerns of sepsis secondary to urinary tract infection. Patient also complained of urinary frequency and incontinence.  Patient met sepsis criteria on presentation with fever, tachycardia, leukocytosis and abnormal urinalysis.  Blood cultures were sent. CT scan of the abdomen showed cystitis and possible left pyelonephritis.  Patient was started on IV Rocephin and was admitted to the hospital.  Blood cultures showed E. coli bacteremia.  Assessment/Plan:  Principal Problem:   Sepsis secondary to UTI [facility]) Active Problems:   Tobacco use disorder   Vascular dementia ([place])   Mild protein-calorie malnutrition ([place])   Rheumatoid arthritis ([place])   Sepsis ([place])   Mass of hard palate   Sepsis secondary to E. Coli bacteremia likely secondary to UTI with left pyelonephritis-complicated UTI.  History of rheumatoid arthritis on methotrexate.  Patient presented with sepsis on presentation.  On IV Rocephin.  Urine culture negative so far.  Blood culture from 01/07/20 positive for E. coli bacteremia.  E. coli is pansensitive at this time.  Continue gentle IV fluids.  Aspirin on hold due to mild hematuria.  Patient is immune compromised we will continue IV antibiotics.   Patient is afebrile with no leukocytosis.  Supraventricular tachycardia.  No further episodes.  Mild protein-calorie malnutrition -present on admission.  Nutrition on board.  Continue Remeron for appetite improvement.   Tobacco use disorder - counseled on cessation  Vascular dementia -on Aricept.  At baseline.  Rheumatoid arthritis -chronically  takes methotrexate and folic acid, continue while in the hospital  Mass of hard palate - chronic, no acute issues. Monitor as outpatient.  Depression/Anxiety - continue Remeron  Vitamin D deficiency - on supplement at home.  Will resume on discharge.  DVT prophylaxis: enoxaparin (LOVENOX) injection 30 mg Start: 01/07/20 2200 Place TED hose Start: 01/07/20 1926   Code Status: DNR  Family Communication: I again spoke with the patient's daughter at bedside.  Status is: Inpatient  Remains inpatient appropriate because:IV treatments appropriate due to intensity of illness or inability to take PO and Inpatient level of care appropriate due to severity of illness IV antibiotics, need for skilled nursing facility  Dispo: The patient is from: Home              Anticipated d/c is to: Skilled nursing facility recommended by PT.              Anticipated d/c date is: 1 to 2 days, when SNF available.              Patient currently  medically stable to d/c.  Consultants:  None  Procedures:  None  Antibiotics:  . Rocephin IV 11/21>  Anti-infectives (From admission, onward)   Start     Dose/Rate Route Frequency Ordered Stop   01/09/20 1030  cefTRIAXone (ROCEPHIN) 2 g in sodium chloride 0.9 % 100 mL IVPB        2 g 200 mL/hr over 30 Minutes Intravenous Daily 01/09/20 0934     01/08/20 1800  cefTRIAXone (ROCEPHIN) 1 g in sodium chloride 0.9 % 100 mL IVPB  Status:  Discontinued        1 g 200 mL/hr over 30 Minutes Intravenous  Every 24 hours 01/07/20 1929 01/09/20 0934   01/07/20 1730  cefTRIAXone (ROCEPHIN) 1 g in sodium chloride 0.9 % 100 mL IVPB        1 g 200 mL/hr over 30 Minutes Intravenous  Once 01/07/20 1722 01/07/20 1809     Subjective: Today, patient was seen and examined at bedside.  Patient's daughter at bedside.  Denies any nausea vomiting abdominal pain.  Generalized weakness. Objective: Vitals:   01/11/20 0420 01/11/20 0828  BP: 135/72 (!) 151/88  Pulse: 79 88    Resp: 20 20  Temp: 97.7 F (36.5 C) 98.1 F (36.7 C)  SpO2: 97% 94%    Intake/Output Summary (Last 24 hours) at 01/11/2020 0910 Last data filed at 01/11/2020 0511 Gross per 24 hour  Intake 1245.85 ml  Output 100 ml  Net 1145.85 ml   Filed Weights   01/07/20 1413 01/07/20 2146  Weight: 46.7 kg 49.5 kg   Body mass index is 19.33 kg/m.   Physical Exam:  GENERAL: Patient is alert awake and communicative,. Not in obvious distress, thinly built HENT: No scleral pallor or icterus. Pupils equally reactive to light. Oral mucosa is moist NECK: is supple, no gross swelling noted. CHEST: Clear to auscultation. No crackles or wheezes.  Diminished breath sounds bilaterally. CVS: S1 and S2 heard, no murmur. Regular rate and rhythm.  ABDOMEN: Soft, non-tender, bowel sounds are present. EXTREMITIES: No edema. CNS: Generalized weakness of all extremities. SKIN: warm and dry without rashes.  Data Review: I have personally reviewed the following laboratory data and studies,  CBC: Recent Labs  Lab 01/07/20 1416 01/08/20 0558 01/09/20 0418 01/10/20 0635  WBC 22.5* 13.3* 6.2 6.5  HGB 12.4 11.6* 10.7* 11.6*  HCT 38.2 36.5 32.3* 34.5*  MCV 102.1* 104.0* 100.6* 99.1  PLT 338 278 235 979   Basic Metabolic Panel: Recent Labs  Lab 01/07/20 1416 01/08/20 0558 01/09/20 0418  NA 138 135 135  K 4.7 4.3 4.2  CL 102 100 104  CO2 '23 25 24  ' GLUCOSE 116* 84 91  BUN 31* 29* 29*  CREATININE 1.16* 1.02* 1.04*  CALCIUM 9.2 8.5* 7.8*   Liver Function Tests: No results for input(s): AST, ALT, ALKPHOS, BILITOT, PROT, ALBUMIN in the last 168 hours. No results for input(s): LIPASE, AMYLASE in the last 168 hours. No results for input(s): AMMONIA in the last 168 hours. Cardiac Enzymes: No results for input(s): CKTOTAL, CKMB, CKMBINDEX, TROPONINI in the last 168 hours. BNP (last 3 results) No results for input(s): BNP in the last 8760 hours.  ProBNP (last 3 results) No results for input(s):  PROBNP in the last 8760 hours.  CBG: No results for input(s): GLUCAP in the last 168 hours. Recent Results (from the past 240 hour(s))  Blood Culture (routine x 2)     Status: None (Preliminary result)   Collection Time: 01/07/20  5:08 PM   Specimen: BLOOD  Result Value Ref Range Status   Specimen Description BLOOD RA  Final   Special Requests   Final    BOTTLES DRAWN AEROBIC AND ANAEROBIC Blood Culture adequate volume   Culture   Final    NO GROWTH 4 DAYS Performed at [facility], [address], [place], [place] [address]    Report Status PENDING  Incomplete  Blood Culture (routine x 2)     Status: Abnormal   Collection Time: 01/07/20  5:09 PM   Specimen: BLOOD  Result Value Ref Range Status   Specimen Description   Final  BLOOD LAC Performed at [facility], [address] [place], [place] [address]    Special Requests   Final    BOTTLES DRAWN AEROBIC AND ANAEROBIC Blood Culture results may not be optimal due to an inadequate volume of blood received in culture bottles Performed at [facility], [address] [place] [address], [place],  [address]    Culture  Setup Time   Final    GRAM NEGATIVE RODS ANAEROBIC BOTTLE ONLY CRITICAL RESULT CALLED TO, READ BACK BY AND VERIFIED WITH: Lesia Hausen D AT 4174 ON 01/09/20 SNG Performed at [facility], [place] [address], [place], [place] [address]    Culture ESCHERICHIA COLI (A)  Final   Report Status 01/11/2020 FINAL  Final   Organism ID, Bacteria ESCHERICHIA COLI  Final      Susceptibility   Escherichia coli - MIC*    AMPICILLIN 8 SENSITIVE Sensitive     CEFAZOLIN <=4 SENSITIVE Sensitive     CEFEPIME <=0.12 SENSITIVE Sensitive     CEFTAZIDIME <=1 SENSITIVE Sensitive     CEFTRIAXONE <=0.25 SENSITIVE Sensitive     CIPROFLOXACIN <=0.25 SENSITIVE Sensitive     GENTAMICIN <=1 SENSITIVE Sensitive     IMIPENEM <=0.25 SENSITIVE Sensitive     TRIMETH/SULFA <=20 SENSITIVE Sensitive      AMPICILLIN/SULBACTAM <=2 SENSITIVE Sensitive     PIP/TAZO <=4 SENSITIVE Sensitive     * ESCHERICHIA COLI  Blood Culture ID Panel (Reflexed)     Status: Abnormal   Collection Time: 01/07/20  5:09 PM  Result Value Ref Range Status   Enterococcus faecalis NOT DETECTED NOT DETECTED Final   Enterococcus Faecium NOT DETECTED NOT DETECTED Final   Listeria monocytogenes NOT DETECTED NOT DETECTED Final   Staphylococcus species NOT DETECTED NOT DETECTED Final   Staphylococcus aureus (BCID) NOT DETECTED NOT DETECTED Final   Staphylococcus epidermidis NOT DETECTED NOT DETECTED Final   Staphylococcus lugdunensis NOT DETECTED NOT DETECTED Final   Streptococcus species NOT DETECTED NOT DETECTED Final   Streptococcus agalactiae NOT DETECTED NOT DETECTED Final   Streptococcus pneumoniae NOT DETECTED NOT DETECTED Final   Streptococcus pyogenes NOT DETECTED NOT DETECTED Final   A.calcoaceticus-baumannii NOT DETECTED NOT DETECTED Final   Bacteroides fragilis NOT DETECTED NOT DETECTED Final   Enterobacterales DETECTED (A) NOT DETECTED Final    Comment: Enterobacterales represent a large order of gram negative bacteria, not a single organism. CRITICAL RESULT CALLED TO, READ BACK BY AND VERIFIED WITH: LISA KLUTZ PHARM D AT 8185 ON 01/09/20 SNG    Enterobacter cloacae complex NOT DETECTED NOT DETECTED Final   Escherichia coli DETECTED (A) NOT DETECTED Final    Comment: CRITICAL RESULT CALLED TO, READ BACK BY AND VERIFIED WITH: LISA KLUTZ PHARM D AT 0928 ON 01/09/20 SNG    Klebsiella aerogenes NOT DETECTED NOT DETECTED Final   Klebsiella oxytoca NOT DETECTED NOT DETECTED Final   Klebsiella pneumoniae NOT DETECTED NOT DETECTED Final   Proteus species NOT DETECTED NOT DETECTED Final   Salmonella species NOT DETECTED NOT DETECTED Final   Serratia marcescens NOT DETECTED NOT DETECTED Final   Haemophilus influenzae NOT DETECTED NOT DETECTED Final   Neisseria meningitidis NOT DETECTED NOT DETECTED Final    Pseudomonas aeruginosa NOT DETECTED NOT DETECTED Final   Stenotrophomonas maltophilia NOT DETECTED NOT DETECTED Final   Candida albicans NOT DETECTED NOT DETECTED Final   Candida auris NOT DETECTED NOT DETECTED Final   Candida glabrata NOT DETECTED NOT DETECTED Final   Candida krusei NOT DETECTED NOT  DETECTED Final   Candida parapsilosis NOT DETECTED NOT DETECTED Final   Candida tropicalis NOT DETECTED NOT DETECTED Final   Cryptococcus neoformans/gattii NOT DETECTED NOT DETECTED Final   CTX-M ESBL NOT DETECTED NOT DETECTED Final   Carbapenem resistance IMP NOT DETECTED NOT DETECTED Final   Carbapenem resistance KPC NOT DETECTED NOT DETECTED Final   Carbapenem resistance NDM NOT DETECTED NOT DETECTED Final   Carbapenem resist OXA 48 LIKE NOT DETECTED NOT DETECTED Final   Carbapenem resistance VIM NOT DETECTED NOT DETECTED Final    Comment: Performed at [facility], [address] [place], [place] [address]  Resp Panel by RT-PCR (Flu A&B, Covid) Nasopharyngeal Swab     Status: None   Collection Time: 01/07/20  7:37 PM   Specimen: Nasopharyngeal Swab; Nasopharyngeal(NP) swabs in vial transport medium  Result Value Ref Range Status   SARS Coronavirus 2 by RT PCR NEGATIVE NEGATIVE Final    Comment: (NOTE) SARS-CoV-2 target nucleic acids are NOT DETECTED.  The SARS-CoV-2 RNA is generally detectable in upper respiratory specimens during the acute phase of infection. The lowest concentration of SARS-CoV-2 viral copies this assay can detect is 138 copies/mL. A negative result does not preclude SARS-Cov-2 infection and should not be used as the sole basis for treatment or other patient management decisions. A negative result may occur with  improper specimen collection/handling, submission of specimen other than nasopharyngeal swab, presence of viral mutation(s) within the areas targeted by this assay, and inadequate number of viral copies(<138 copies/mL). A negative result must be  combined with clinical observations, patient history, and epidemiological information. The expected result is Negative.  Fact Sheet for Patients:  EntrepreneurPulse.com.au  Fact Sheet for Healthcare Providers:  IncredibleEmployment.be  This test is no t yet approved or cleared by the [place] FDA and  has been authorized for detection and/or diagnosis of SARS-CoV-2 by FDA under an Emergency Use Authorization (EUA). This EUA will remain  in effect (meaning this test can be used) for the duration of the COVID-19 declaration under Section 564(b)(1) of the Act, 21 U.S.C.section 360bbb-3(b)(1), unless the authorization is terminated  or revoked sooner.       Influenza A by PCR NEGATIVE NEGATIVE Final   Influenza B by PCR NEGATIVE NEGATIVE Final    Comment: (NOTE) The Xpert Xpress SARS-CoV-2/FLU/RSV plus assay is intended as an aid in the diagnosis of influenza from Nasopharyngeal swab specimens and should not be used as a sole basis for treatment. Nasal washings and aspirates are unacceptable for Xpert Xpress SARS-CoV-2/FLU/RSV testing.  Fact Sheet for Patients: EntrepreneurPulse.com.au  Fact Sheet for Healthcare Providers: IncredibleEmployment.be  This test is not yet approved or cleared by the [place] FDA and has been authorized for detection and/or diagnosis of SARS-CoV-2 by FDA under an Emergency Use Authorization (EUA). This EUA will remain in effect (meaning this test can be used) for the duration of the COVID-19 declaration under Section 564(b)(1) of the Act, 21 U.S.C. section 360bbb-3(b)(1), unless the authorization is terminated or revoked.  Performed at [facility], [address], [place], [place] [address]   Urine Culture     Status: None   Collection Time: 01/09/20  9:26 AM   Specimen: Urine, Random  Result Value Ref Range Status   Specimen Description   Final     URINE, RANDOM Performed at [facility], [address] [place], [place] [address]    Special Requests   Final    NONE Performed at [facility], [address], [place], [place] [address]    Culture   Final    NO GROWTH Performed at [facility], [place] [address] [place], [place] [address]    Report Status 01/10/2020 FINAL  Final     Studies: No results found.    Flora Lipps, MD  [facility] 01/11/2020

## 2020-01-12 DIAGNOSIS — F17200 Nicotine dependence, unspecified, uncomplicated: Secondary | ICD-10-CM

## 2020-01-12 DIAGNOSIS — A4151 Sepsis due to Escherichia coli [E. coli]: Principal | ICD-10-CM

## 2020-01-12 DIAGNOSIS — A419 Sepsis, unspecified organism: Secondary | ICD-10-CM | POA: Diagnosis not present

## 2020-01-12 DIAGNOSIS — M069 Rheumatoid arthritis, unspecified: Secondary | ICD-10-CM | POA: Diagnosis not present

## 2020-01-12 DIAGNOSIS — E441 Mild protein-calorie malnutrition: Secondary | ICD-10-CM | POA: Diagnosis not present

## 2020-01-12 DIAGNOSIS — M8588 Other specified disorders of bone density and structure, other site: Secondary | ICD-10-CM | POA: Diagnosis not present

## 2020-01-12 LAB — CBC
HCT: 34.1 % — ABNORMAL LOW (ref 36.0–46.0)
Hemoglobin: 11 g/dL — ABNORMAL LOW (ref 12.0–15.0)
MCH: 32.4 pg (ref 26.0–34.0)
MCHC: 32.3 g/dL (ref 30.0–36.0)
MCV: 100.6 fL — ABNORMAL HIGH (ref 80.0–100.0)
Platelets: 249 10*3/uL (ref 150–400)
RBC: 3.39 MIL/uL — ABNORMAL LOW (ref 3.87–5.11)
RDW: 14 % (ref 11.5–15.5)
WBC: 7.8 10*3/uL (ref 4.0–10.5)
nRBC: 0 % (ref 0.0–0.2)

## 2020-01-12 LAB — BASIC METABOLIC PANEL
Anion gap: 7 (ref 5–15)
BUN: 16 mg/dL (ref 8–23)
CO2: 24 mmol/L (ref 22–32)
Calcium: 8.2 mg/dL — ABNORMAL LOW (ref 8.9–10.3)
Chloride: 109 mmol/L (ref 98–111)
Creatinine, Ser: 0.83 mg/dL (ref 0.44–1.00)
GFR, Estimated: >60 mL/min (ref >60)
Glucose, Bld: 94 mg/dL (ref 70–99)
Potassium: 3.7 mmol/L (ref 3.5–5.1)
Sodium: 140 mmol/L (ref 135–145)

## 2020-01-12 LAB — CULTURE, BLOOD (ROUTINE X 2)
Culture: NO GROWTH
Special Requests: ADEQUATE

## 2020-01-12 LAB — RESP PANEL BY RT-PCR (FLU A&B, COVID) ARPGX2
Influenza A by PCR: NEGATIVE
Influenza B by PCR: NEGATIVE
SARS Coronavirus 2 by RT PCR: NEGATIVE

## 2020-01-12 MED ORDER — LEVOFLOXACIN 500 MG PO TABS: 500.0000 mg | ORAL_TABLET | Freq: Every day | ORAL | 0 refills | Status: DC

## 2020-01-12 MED ORDER — CEFDINIR 300 MG PO CAPS: 300.0000 mg | ORAL_CAPSULE | Freq: Two times a day (BID) | ORAL | 0 refills | Status: AC

## 2020-01-12 NOTE — TOC Transition Note (Signed)
Transition of Care [facility]) - CM/SW Discharge Note   Patient Details  Name: Christine Mccullough MRN: [id number] Date of Birth: July 19, 1934  [facility]) CM/SW Contact:  Margarito Liner, LCSW Phone Number: 01/12/2020, 2:13 PM   Clinical Narrative: Patient has orders to discharge to [facility] today. RN will call report to [phone] (Room 604A). EMS transport has been arranged. She is third on the list. No further concerns. CSW signing off.    Final next level of care: Skilled Nursing Facility Barriers to Discharge: Barriers Resolved   Patient Goals and CMS Choice Patient states their goals for this hospitalization and ongoing recovery are:: Patient not fully oriented. CMS Medicare.gov Compare Post Acute Care list provided to:: Patient Represenative (must comment) (Daughter) Choice offered to / list presented to : Patient, Adult Children  Discharge Placement   Existing PASRR number confirmed : 01/10/20          Patient chooses bed at: [facility] [place] Patient to be transferred to facility by: EMS Name of family member notified: Melvenia Beam Patient and family notified of of transfer: 01/12/20  Discharge Plan and Services                                     Social Determinants of Health (SDOH) Interventions     Readmission Risk Interventions No flowsheet data found.

## 2020-01-12 NOTE — Discharge Summary (Addendum)
Physician Discharge Summary  Christine Mccullough QHU:[id number] DOB: [birth year] DOA: 01/07/2020  PCP: Christine Hector, MD  Admit date: 01/07/2020 Discharge date: 01/12/2020  Admitted From: Home  Discharge disposition:SNF  Recommendations for Outpatient Follow-Up:   . Follow up with your primary care provider at the skilled nursing facility in 3 to 5 days . Check CBC, BMP, magnesium in the next visit . Patient had E. coli bacteremia and pyelonephritis.  Total course of antibiotic 10 days.   . Patient was noted to have a lesion in the hard palate.  Appears to be chronic.  Patient might need outpatient follow-up with PCP   Discharge Diagnosis:   Principal Problem:   Sepsis secondary to UTI [facility]) Active Problems:   Tobacco use disorder   Vascular dementia ([place])   Mild protein-calorie malnutrition ([place])   Rheumatoid arthritis ([place])   Sepsis ([place])   Mass of hard palate  Discharge Condition: Improved.  Diet recommendation:  Regular.  Wound care: None.  Code status: DNR   History of Present Illness:   Christine Mccullough a 85 y.o.femalewith past medical history significant forrheumatoid arthritis involving multiple sites with positive rheumatoid factor, osteoporosis, tobacco abuse, presented to the ED on 01/07/20 from [place] [facility] for concerns of sepsis secondary to urinary tract infection. Patient also complained of urinary frequency and incontinence.  Patient met sepsis criteria on presentation with fever, tachycardia, leukocytosis and abnormal urinalysis.  Blood cultures were sent. CT scan of the abdomen showed cystitis and possible left pyelonephritis.  Patient was started on IV Rocephin and was admitted to the hospital.    Hospital Course:   Following conditions were addressed during hospitalization as listed below,  Sepsis secondary toE. Coli bacteremia likely secondary toUTI with left pyelonephritis-complicated UTI.  History of rheumatoid arthritis on methotrexate.   Patient presented with sepsis on presentation.    Received IV Rocephin.  Urine culture negative so far.  Blood culture from 01/07/20 positive for E. coli bacteremia.  E. coli is pansensitive at this time. Patient is immune compromised so we will continue cefdinir to complete 10-day course of antibiotic.  Patient is afebrile with no leukocytosis.  Supraventricular tachycardia.    Had one episode during hospitalization.  No further episodes noted during hospitalization..  Mild protein-calorie malnutrition-present on admission.  Nutrition saw the patient.  Continue Remeron for appetite improvement.   Tobacco use disorder- counseled on cessation  Vascular dementia-on Aricept.    Resume on discharge  Rheumatoid arthritis-chronically takes methotrexate and folic acid, resume on discharge.  Currently under control.  Mass of hard palate- chronic, no acute issues. Monitor as outpatient.  Depression/Anxiety- continue Remeron  Vitamin D deficiency-continue vitamin D supplements on discharge  Disposition.  At this time, patient is stable for disposition to skilled nursing facility.  Spoke with the patient's daughter at bedside regarding the plan for disposition.  Medical Consultants:    None.  Procedures:    None Subjective:   Today, patient was seen and examined at bedside.  Denies interval complaints.  No fever chills or rigor.  Discharge Exam:   Vitals:   01/12/20 0502 01/12/20 0830  BP: (!) 154/93 (!) 154/80  Pulse: 88 74  Resp: 20 (!) 22  Temp: 98.2 F (36.8 C) 97.7 F (36.5 C)  SpO2: 93% 96%   Vitals:   01/11/20 1945 01/11/20 2327 01/12/20 0502 01/12/20 0830  BP: (!) 155/77 (!) 150/88 (!) 154/93 (!) 154/80  Pulse: 85 85 88 74  Resp: (!) '22 20 20 ' (!)  22  Temp: 98.2 F (36.8 C) 98.8 F (37.1 C) 98.2 F (36.8 C) 97.7 F (36.5 C)  TempSrc: Oral Oral Oral Oral  SpO2: 95% 93% 93% 96%  Weight:      Height:       General: Alert awake, not in obvious  distress, thinly built HENT: pupils equally reacting to light,  No scleral pallor or icterus noted. Oral mucosa is moist.  Hard palate lesion Chest:  Clear breath sounds.  Diminished breath sounds bilaterally. No crackles or wheezes.  CVS: S1 &S2 heard. No murmur.  Regular rate and rhythm. Abdomen: Soft, nontender, nondistended.  Bowel sounds are heard.   Extremities: No cyanosis, clubbing or edema.  Peripheral pulses are palpable. Psych: Alert, awake and oriented, normal mood CNS: Generalized weakness of the extremities Skin: Warm and dry.  No rashes noted.  The results of significant diagnostics from this hospitalization (including imaging, microbiology, ancillary and laboratory) are listed below for reference.     Diagnostic Studies:   CT ABDOMEN PELVIS W CONTRAST  Result Date: 01/07/2020 CLINICAL DATA:  Left CVA tenderness EXAM: CT ABDOMEN AND PELVIS WITH CONTRAST TECHNIQUE: Multidetector CT imaging of the abdomen and pelvis was performed using the standard protocol following bolus administration of intravenous contrast. CONTRAST:  12m OMNIPAQUE IOHEXOL 300 MG/ML  SOLN COMPARISON:  None. FINDINGS: Lower chest: Lung bases demonstrate dependent atelectasis. Cyst or bulla at the left lung base. No acute consolidation or effusion. Borderline to mild cardiomegaly. Hepatobiliary: Significant motion degradation. No focal hepatic abnormality, calcified gallstone or biliary dilatation. Pancreas: Unremarkable. No pancreatic ductal dilatation or surrounding inflammatory changes. Spleen: Normal in size without focal abnormality. Adrenals/Urinary Tract: Adrenal glands are normal. Multiple subcentimeter hypodense renal lesions too small to further characterize. Prominent left extrarenal pelvis without hydronephrosis. Thick-walled appearance of urinary bladder with mucosal enhancement. Possible hypoenhancement at the lower pole of the left kidney on the delayed views Stomach/Bowel: Stomach nonenlarged. No  dilated small bowel. No bowel wall thickening. Negative appendix. Diverticular disease of the sigmoid colon without acute inflammatory process. Vascular/Lymphatic: Moderate aortic atherosclerosis. No aneurysm. No suspicious nodes Reproductive: Uterus and bilateral adnexa are unremarkable. Other: Negative for free air or free fluid. Musculoskeletal: Scoliosis and degenerative changes of the spine. No acute osseous abnormality IMPRESSION: 1. Significant motion degradation which limits the exam. 2. Thick-walled appearance of the urinary bladder with mucosal enhancement, suspicious for cystitis. Possible hypoenhancement at the lower pole of the left kidney on the delayed views, question pyelonephritis. Negative for hydronephrosis. 3. Sigmoid colon diverticular disease without acute inflammatory process. Aortic Atherosclerosis (ICD10-I70.0). Electronically Signed   By: KDonavan FoilM.D.   On: 01/07/2020 21:59   DG Chest Portable 1 View  Result Date: 01/07/2020 CLINICAL DATA:  Tachypnea, fever EXAM: PORTABLE CHEST 1 VIEW COMPARISON:  None. FINDINGS: Heart is borderline in size. Mild peribronchial thickening and interstitial prominence throughout the lungs. Left basilar opacity, likely atelectasis. No effusions or acute bony abnormality. IMPRESSION: Peribronchial thickening and interstitial prominence of the lungs. Findings could reflect bronchitis either acute or chronic or atypical infection. Left base atelectasis. Electronically Signed   By: KRolm BaptiseM.D.   On: 01/07/2020 18:50     Labs:   Basic Metabolic Panel: Recent Labs  Lab 01/07/20 1416 01/07/20 1416 01/08/20 0558 01/08/20 0558 01/09/20 0418 01/12/20 0519  NA 138  --  135  --  135 140  K 4.7   < > 4.3   < > 4.2 3.7  CL 102  --  100  --  104 109  CO2 23  --  25  --  24 24  GLUCOSE 116*  --  84  --  91 94  BUN 31*  --  29*  --  29* 16  CREATININE 1.16*  --  1.02*  --  1.04* 0.83  CALCIUM 9.2  --  8.5*  --  7.8* 8.2*   < > = values in  this interval not displayed.   GFR Estimated Creatinine Clearance: 40.1 mL/min (by C-G formula based on SCr of 0.83 mg/dL). Liver Function Tests: No results for input(s): AST, ALT, ALKPHOS, BILITOT, PROT, ALBUMIN in the last 168 hours. No results for input(s): LIPASE, AMYLASE in the last 168 hours. No results for input(s): AMMONIA in the last 168 hours. Coagulation profile Recent Labs  Lab 01/08/20 0558  INR 1.2    CBC: Recent Labs  Lab 01/07/20 1416 01/08/20 0558 01/09/20 0418 01/10/20 0635 01/12/20 0519  WBC 22.5* 13.3* 6.2 6.5 7.8  HGB 12.4 11.6* 10.7* 11.6* 11.0*  HCT 38.2 36.5 32.3* 34.5* 34.1*  MCV 102.1* 104.0* 100.6* 99.1 100.6*  PLT 338 278 235 244 249   Cardiac Enzymes: No results for input(s): CKTOTAL, CKMB, CKMBINDEX, TROPONINI in the last 168 hours. BNP: Invalid input(s): POCBNP CBG: No results for input(s): GLUCAP in the last 168 hours. D-Dimer No results for input(s): DDIMER in the last 72 hours. Hgb A1c No results for input(s): HGBA1C in the last 72 hours. Lipid Profile No results for input(s): CHOL, HDL, LDLCALC, TRIG, CHOLHDL, LDLDIRECT in the last 72 hours. Thyroid function studies No results for input(s): TSH, T4TOTAL, T3FREE, THYROIDAB in the last 72 hours.  Invalid input(s): FREET3 Anemia work up No results for input(s): VITAMINB12, FOLATE, FERRITIN, TIBC, IRON, RETICCTPCT in the last 72 hours. Microbiology Recent Results (from the past 240 hour(s))  Blood Culture (routine x 2)     Status: None   Collection Time: 01/07/20  5:08 PM   Specimen: BLOOD  Result Value Ref Range Status   Specimen Description BLOOD RA  Final   Special Requests   Final    BOTTLES DRAWN AEROBIC AND ANAEROBIC Blood Culture adequate volume   Culture   Final    NO GROWTH 5 DAYS Performed at [facility], [address], [place], [place] [address]    Report Status 01/12/2020 FINAL  Final  Blood Culture (routine x 2)     Status: Abnormal   Collection  Time: 01/07/20  5:09 PM   Specimen: BLOOD  Result Value Ref Range Status   Specimen Description   Final    BLOOD LAC Performed at [facility], [address] [place], [place] [address]    Special Requests   Final    BOTTLES DRAWN AEROBIC AND ANAEROBIC Blood Culture results may not be optimal due to an inadequate volume of blood received in culture bottles Performed at [facility], [address], [place], [place] [address]    Culture  Setup Time   Final    GRAM NEGATIVE RODS ANAEROBIC BOTTLE ONLY CRITICAL RESULT CALLED TO, READ BACK BY AND VERIFIED WITH: Lesia Hausen D AT 8182 ON 01/09/20 SNG Performed at [facility], [place] [address] [place], [place] [address]    Culture ESCHERICHIA COLI (A)  Final   Report Status 01/11/2020 FINAL  Final   Organism ID, Bacteria ESCHERICHIA COLI  Final      Susceptibility   Escherichia coli - MIC*    AMPICILLIN 8 SENSITIVE Sensitive  CEFAZOLIN <=4 SENSITIVE Sensitive     CEFEPIME <=0.12 SENSITIVE Sensitive     CEFTAZIDIME <=1 SENSITIVE Sensitive     CEFTRIAXONE <=0.25 SENSITIVE Sensitive     CIPROFLOXACIN <=0.25 SENSITIVE Sensitive     GENTAMICIN <=1 SENSITIVE Sensitive     IMIPENEM <=0.25 SENSITIVE Sensitive     TRIMETH/SULFA <=20 SENSITIVE Sensitive     AMPICILLIN/SULBACTAM <=2 SENSITIVE Sensitive     PIP/TAZO <=4 SENSITIVE Sensitive     * ESCHERICHIA COLI  Blood Culture ID Panel (Reflexed)     Status: Abnormal   Collection Time: 01/07/20  5:09 PM  Result Value Ref Range Status   Enterococcus faecalis NOT DETECTED NOT DETECTED Final   Enterococcus Faecium NOT DETECTED NOT DETECTED Final   Listeria monocytogenes NOT DETECTED NOT DETECTED Final   Staphylococcus species NOT DETECTED NOT DETECTED Final   Staphylococcus aureus (BCID) NOT DETECTED NOT DETECTED Final   Staphylococcus epidermidis NOT DETECTED NOT DETECTED Final   Staphylococcus lugdunensis NOT DETECTED NOT DETECTED Final   Streptococcus  species NOT DETECTED NOT DETECTED Final   Streptococcus agalactiae NOT DETECTED NOT DETECTED Final   Streptococcus pneumoniae NOT DETECTED NOT DETECTED Final   Streptococcus pyogenes NOT DETECTED NOT DETECTED Final   A.calcoaceticus-baumannii NOT DETECTED NOT DETECTED Final   Bacteroides fragilis NOT DETECTED NOT DETECTED Final   Enterobacterales DETECTED (A) NOT DETECTED Final    Comment: Enterobacterales represent a large order of gram negative bacteria, not a single organism. CRITICAL RESULT CALLED TO, READ BACK BY AND VERIFIED WITH: LISA KLUTZ PHARM D AT 8502 ON 01/09/20 SNG    Enterobacter cloacae complex NOT DETECTED NOT DETECTED Final   Escherichia coli DETECTED (A) NOT DETECTED Final    Comment: CRITICAL RESULT CALLED TO, READ BACK BY AND VERIFIED WITH: LISA KLUTZ PHARM D AT 0928 ON 01/09/20 SNG    Klebsiella aerogenes NOT DETECTED NOT DETECTED Final   Klebsiella oxytoca NOT DETECTED NOT DETECTED Final   Klebsiella pneumoniae NOT DETECTED NOT DETECTED Final   Proteus species NOT DETECTED NOT DETECTED Final   Salmonella species NOT DETECTED NOT DETECTED Final   Serratia marcescens NOT DETECTED NOT DETECTED Final   Haemophilus influenzae NOT DETECTED NOT DETECTED Final   Neisseria meningitidis NOT DETECTED NOT DETECTED Final   Pseudomonas aeruginosa NOT DETECTED NOT DETECTED Final   Stenotrophomonas maltophilia NOT DETECTED NOT DETECTED Final   Candida albicans NOT DETECTED NOT DETECTED Final   Candida auris NOT DETECTED NOT DETECTED Final   Candida glabrata NOT DETECTED NOT DETECTED Final   Candida krusei NOT DETECTED NOT DETECTED Final   Candida parapsilosis NOT DETECTED NOT DETECTED Final   Candida tropicalis NOT DETECTED NOT DETECTED Final   Cryptococcus neoformans/gattii NOT DETECTED NOT DETECTED Final   CTX-M ESBL NOT DETECTED NOT DETECTED Final   Carbapenem resistance IMP NOT DETECTED NOT DETECTED Final   Carbapenem resistance KPC NOT DETECTED NOT DETECTED Final    Carbapenem resistance NDM NOT DETECTED NOT DETECTED Final   Carbapenem resist OXA 48 LIKE NOT DETECTED NOT DETECTED Final   Carbapenem resistance VIM NOT DETECTED NOT DETECTED Final    Comment: Performed at [facility], [address] [place], [place] [address]  Resp Panel by RT-PCR (Flu A&B, Covid) Nasopharyngeal Swab     Status: None   Collection Time: 01/07/20  7:37 PM   Specimen: Nasopharyngeal Swab; Nasopharyngeal(NP) swabs in vial transport medium  Result Value Ref Range Status   SARS Coronavirus 2 by RT PCR NEGATIVE NEGATIVE Final  Comment: (NOTE) SARS-CoV-2 target nucleic acids are NOT DETECTED.  The SARS-CoV-2 RNA is generally detectable in upper respiratory specimens during the acute phase of infection. The lowest concentration of SARS-CoV-2 viral copies this assay can detect is 138 copies/mL. A negative result does not preclude SARS-Cov-2 infection and should not be used as the sole basis for treatment or other patient management decisions. A negative result may occur with  improper specimen collection/handling, submission of specimen other than nasopharyngeal swab, presence of viral mutation(s) within the areas targeted by this assay, and inadequate number of viral copies(<138 copies/mL). A negative result must be combined with clinical observations, patient history, and epidemiological information. The expected result is Negative.  Fact Sheet for Patients:  EntrepreneurPulse.com.au  Fact Sheet for Healthcare Providers:  IncredibleEmployment.be  This test is no t yet approved or cleared by the [place] FDA and  has been authorized for detection and/or diagnosis of SARS-CoV-2 by FDA under an Emergency Use Authorization (EUA). This EUA will remain  in effect (meaning this test can be used) for the duration of the COVID-19 declaration under Section 564(b)(1) of the Act, 21 U.S.C.section 360bbb-3(b)(1), unless the  authorization is terminated  or revoked sooner.       Influenza A by PCR NEGATIVE NEGATIVE Final   Influenza B by PCR NEGATIVE NEGATIVE Final    Comment: (NOTE) The Xpert Xpress SARS-CoV-2/FLU/RSV plus assay is intended as an aid in the diagnosis of influenza from Nasopharyngeal swab specimens and should not be used as a sole basis for treatment. Nasal washings and aspirates are unacceptable for Xpert Xpress SARS-CoV-2/FLU/RSV testing.  Fact Sheet for Patients: EntrepreneurPulse.com.au  Fact Sheet for Healthcare Providers: IncredibleEmployment.be  This test is not yet approved or cleared by the [place] FDA and has been authorized for detection and/or diagnosis of SARS-CoV-2 by FDA under an Emergency Use Authorization (EUA). This EUA will remain in effect (meaning this test can be used) for the duration of the COVID-19 declaration under Section 564(b)(1) of the Act, 21 U.S.C. section 360bbb-3(b)(1), unless the authorization is terminated or revoked.  Performed at [facility], [address] [place], [place] [address]   Urine Culture     Status: None   Collection Time: 01/09/20  9:26 AM   Specimen: Urine, Random  Result Value Ref Range Status   Specimen Description   Final    URINE, RANDOM Performed at [facility], [address] [place], [place] [address]    Special Requests   Final    NONE Performed at [facility], [address], [place], [place] [address]    Culture   Final    NO GROWTH Performed at [facility], [place] [address] [place], [place] [address]    Report Status 01/10/2020 FINAL  Final     Discharge Instructions:   Discharge Instructions    Call MD for:  temperature >100.4   Complete by: As directed    Diet general   Complete by: As directed    Discharge instructions   Complete by: As directed    Please follow-up with your primary care provider at the skilled  nursing facility in 3 to 5 days.  Check blood work at that time.  Continue medications as prescribed.   Increase activity slowly   Complete by: As directed      Allergies as of 01/12/2020      Reactions   Atorvastatin Other (See Comments)   Codeine Other (See Comments)  agitation   Propoxyphene Nausea And Vomiting   Pneumococcal Polysaccharide Vaccine Rash      Medication List    TAKE these medications   aspirin EC 81 MG tablet Take 81 mg by mouth daily. Swallow whole.   cefdinir 300 MG capsule Commonly known as: OMNICEF Take 1 capsule (300 mg total) by mouth 2 (two) times daily for 4 days.   cholecalciferol 25 MCG (1000 UNIT) tablet Commonly known as: VITAMIN D3 Take 2,000 Units by mouth daily.   donepezil 10 MG tablet Commonly known as: ARICEPT Take 10 mg by mouth daily.   folic acid 1 MG tablet Commonly known as: FOLVITE Take 1 mg by mouth daily.   ibuprofen 200 MG tablet Commonly known as: ADVIL Take 400 mg by mouth every 6 (six) hours as needed for fever or moderate pain.   methotrexate 2.5 MG tablet Take 20 mg by mouth every Sunday.   mirtazapine 7.5 MG tablet Commonly known as: REMERON Take 7.5 mg by mouth at bedtime.          Contact information for after-discharge care    Destination    [facility] Preferred SNF .   Service: Skilled Nursing Contact information: [address] [place] [phone]                   Time coordinating discharge: 39 minutes  Signed:  Anhad Sheeley  [facility] 01/12/2020, 12:56 PM

## 2020-01-12 NOTE — Progress Notes (Signed)
Report was given to [facility] RN accepting patient. Explained patients recent/past medical history. AVS sent with EMS. VSS. Patient departed with EMS via stretcher.

## 2020-01-12 NOTE — TOC Progression Note (Addendum)
Transition of Care [facility]) - Progression Note    Patient Details  Name: Christine Mccullough MRN: [id number] Date of Birth: 09-06-1934  Transition of Care [facility]) CM/SW Contact  Candie Chroman, LCSW Phone Number: 01/12/2020, 10:50 AM  Clinical Narrative:  CSW met with patient and daughter at bedside. Patient now agreeable to SNF placement. No bed offers yet. [facility], [facility], [place] are still pending. Reviewed with patient and daughter. [facility] first [facility]. Admissions coordinator has reviewed and can offer a bed. Sent secure chat to MD to see if there is an anticipated discharge date yet.    11:33 am: Per MD, patient is stable for discharge today. [facility] has a bed available. Patient and daughter are aware and agreeable. Sent secure chat to MD to request rapid COVID test. DNR on front of chart to be signed.  12:31 pm: SNF admissions coordinator needs discharge summary and orders by 2:00. Paged MD to notify.  Expected Discharge Plan: [place] Barriers to Discharge: Continued Medical Work up  Expected Discharge Plan and Services Expected Discharge Plan: [place] arrangements for the past 2 months: Single Family Home                                       Social Determinants of Health (SDOH) Interventions    Readmission Risk Interventions No flowsheet data found.

## 2020-01-12 NOTE — Care Management Important Message (Signed)
Important Message  Patient Details  Name: Christine Mccullough MRN: [id number] Date of Birth: 06-Sep-1934   Medicare Important Message Given:  Yes     Johnell Comings 01/12/2020, 1:47 PM

## 2020-01-13 LAB — METHYLMALONIC ACID, SERUM: Methylmalonic Acid, Quantitative: 146 nmol/L (ref 0–378)

## 2020-02-13 ENCOUNTER — Other Ambulatory Visit
Admission: RE | Admit: 2020-02-13 | Discharge: 2020-02-13 | Disposition: A | Discharge status: Home / Self Care | Payer: Medicare Other | Source: Ambulatory Visit | Attending: Rheumatology | Admitting: Rheumatology

## 2020-02-13 DIAGNOSIS — M25561 Pain in right knee: Secondary | ICD-10-CM | POA: Insufficient documentation

## 2020-02-13 LAB — SYNOVIAL CELL COUNT + DIFF, W/ CRYSTALS
Crystals, Fluid: NONE SEEN
Eosinophils-Synovial: 0 %
Lymphocytes-Synovial Fld: 3 %
Monocyte-Macrophage-Synovial Fluid: 1 %
Neutrophil, Synovial: 96 %
WBC, Synovial: 7244 /mm3 — ABNORMAL HIGH (ref 0–200)

## 2020-02-23 ENCOUNTER — Emergency Department: Payer: Medicare Other

## 2020-02-23 ENCOUNTER — Other Ambulatory Visit: Payer: Self-pay

## 2020-02-23 DIAGNOSIS — J441 Chronic obstructive pulmonary disease with (acute) exacerbation: Secondary | ICD-10-CM | POA: Insufficient documentation

## 2020-02-23 DIAGNOSIS — Z7982 Long term (current) use of aspirin: Secondary | ICD-10-CM | POA: Insufficient documentation

## 2020-02-23 DIAGNOSIS — Z8673 Personal history of transient ischemic attack (TIA), and cerebral infarction without residual deficits: Secondary | ICD-10-CM | POA: Diagnosis not present

## 2020-02-23 DIAGNOSIS — Z716 Tobacco abuse counseling: Secondary | ICD-10-CM | POA: Diagnosis not present

## 2020-02-23 DIAGNOSIS — Z20822 Contact with and (suspected) exposure to covid-19: Secondary | ICD-10-CM | POA: Insufficient documentation

## 2020-02-23 DIAGNOSIS — F0390 Unspecified dementia without behavioral disturbance: Secondary | ICD-10-CM | POA: Insufficient documentation

## 2020-02-23 DIAGNOSIS — F17210 Nicotine dependence, cigarettes, uncomplicated: Secondary | ICD-10-CM | POA: Insufficient documentation

## 2020-02-23 DIAGNOSIS — R0602 Shortness of breath: Secondary | ICD-10-CM | POA: Diagnosis present

## 2020-02-23 LAB — CBC
HCT: 41.3 % (ref 36.0–46.0)
Hemoglobin: 13 g/dL (ref 12.0–15.0)
MCH: 31.6 pg (ref 26.0–34.0)
MCHC: 31.5 g/dL (ref 30.0–36.0)
MCV: 100.5 fL — ABNORMAL HIGH (ref 80.0–100.0)
Platelets: 413 10*3/uL — ABNORMAL HIGH (ref 150–400)
RBC: 4.11 MIL/uL (ref 3.87–5.11)
RDW: 16 % — ABNORMAL HIGH (ref 11.5–15.5)
WBC: 13.9 10*3/uL — ABNORMAL HIGH (ref 4.0–10.5)
nRBC: 0 % (ref 0.0–0.2)

## 2020-02-23 LAB — BASIC METABOLIC PANEL
Anion gap: 9 (ref 5–15)
BUN: 37 mg/dL — ABNORMAL HIGH (ref 8–23)
CO2: 23 mmol/L (ref 22–32)
Calcium: 9.4 mg/dL (ref 8.9–10.3)
Chloride: 103 mmol/L (ref 98–111)
Creatinine, Ser: 1.02 mg/dL — ABNORMAL HIGH (ref 0.44–1.00)
GFR, Estimated: 55 mL/min — ABNORMAL LOW (ref >60)
Glucose, Bld: 115 mg/dL — ABNORMAL HIGH (ref 70–99)
Potassium: 4.8 mmol/L (ref 3.5–5.1)
Sodium: 135 mmol/L (ref 135–145)

## 2020-02-23 MED ORDER — ALBUTEROL SULFATE HFA 108 (90 BASE) MCG/ACT IN AERS
2.0000 | INHALATION_SPRAY | RESPIRATORY_TRACT | Status: DC | PRN
  Filled 2020-02-23: qty 6.7

## 2020-02-23 NOTE — ED Triage Notes (Signed)
Pt arrives [facility] from home where family stated she has been "sick". Non productive cough noted, upper wheezing, accesssory muscle use and unable to speak in full sentences on arrival per EMS.   Given 1 albuterol en route, improved from 94% to 98% room air  Pt 98.8, CBG 180, HR 92 22G L AC

## 2020-02-24 ENCOUNTER — Emergency Department
Admission: EM | Admit: 2020-02-24 | Discharge: 2020-02-24 | Disposition: A | Discharge status: Home / Self Care | Payer: Medicare Other | Attending: Emergency Medicine | Admitting: Emergency Medicine

## 2020-02-24 DIAGNOSIS — J441 Chronic obstructive pulmonary disease with (acute) exacerbation: Secondary | ICD-10-CM

## 2020-02-24 DIAGNOSIS — Z716 Tobacco abuse counseling: Secondary | ICD-10-CM

## 2020-02-24 DIAGNOSIS — Z20822 Contact with and (suspected) exposure to covid-19: Secondary | ICD-10-CM

## 2020-02-24 LAB — PROCALCITONIN: Procalcitonin: <0.1 ng/mL

## 2020-02-24 LAB — TROPONIN I (HIGH SENSITIVITY)
Troponin I (High Sensitivity): 17 ng/L (ref <18)
Troponin I (High Sensitivity): 15 ng/L (ref <18)

## 2020-02-24 LAB — POC SARS CORONAVIRUS 2 AG -  ED: SARS Coronavirus 2 Ag: NEGATIVE

## 2020-02-24 LAB — SARS CORONAVIRUS 2 (TAT 6-24 HRS): SARS Coronavirus 2: NEGATIVE

## 2020-02-24 MED ORDER — SODIUM CHLORIDE 0.9 % IV SOLN
1.0000 g | Freq: Once | INTRAVENOUS | Status: AC
  Administered 2020-02-24: 1 g via INTRAVENOUS
  Filled 2020-02-24: qty 10

## 2020-02-24 MED ORDER — METHYLPREDNISOLONE SODIUM SUCC 125 MG IJ SOLR
125.0000 mg | Freq: Once | INTRAMUSCULAR | Status: AC
  Administered 2020-02-24: 125 mg via INTRAVENOUS
  Filled 2020-02-24: qty 2

## 2020-02-24 MED ORDER — AZITHROMYCIN 500 MG PO TABS
500.0000 mg | ORAL_TABLET | Freq: Once | ORAL | Status: AC
  Administered 2020-02-24: 500 mg via ORAL
  Filled 2020-02-24: qty 1

## 2020-02-24 MED ORDER — IPRATROPIUM-ALBUTEROL 0.5-2.5 (3) MG/3ML IN SOLN
3.0000 mL | Freq: Once | RESPIRATORY_TRACT | Status: AC
  Administered 2020-02-24: 3 mL via RESPIRATORY_TRACT
  Filled 2020-02-24: qty 3

## 2020-02-24 MED ORDER — ALBUTEROL SULFATE HFA 108 (90 BASE) MCG/ACT IN AERS: 2.0000 | INHALATION_SPRAY | RESPIRATORY_TRACT | 2 refills | Status: AC | PRN

## 2020-02-24 MED ORDER — PREDNISONE 10 MG PO TABS: 40.0000 mg | ORAL_TABLET | Freq: Every day | ORAL | 0 refills | Status: AC

## 2020-02-24 MED ORDER — DOXYCYCLINE HYCLATE 100 MG PO CAPS: 100.0000 mg | ORAL_CAPSULE | Freq: Two times a day (BID) | ORAL | 0 refills | Status: AC

## 2020-02-24 MED ORDER — SODIUM CHLORIDE 0.9 % IV SOLN
500.0000 mg | Freq: Once | INTRAVENOUS | Status: DC
  Filled 2020-02-24: qty 500

## 2020-02-24 NOTE — ED Notes (Signed)
Lab called for Trop add on

## 2020-02-24 NOTE — ED Provider Notes (Signed)
[facility] Emergency Department Provider Note  ____________________________________________   Event Date/Time   First MD Initiated Contact with Patient 02/24/20 [phone]     (approximate)  I have reviewed the triage vital signs and the nursing notes.   HISTORY  Chief Complaint Shortness of Breath   HPI Christine Mccullough is a 86 y.o. female with past medical history significant forRA, osteoporosis, tobacco abuse, and dementia who presents accompanied by her son for assessment of shortness of breath associate with increased cough from baseline and increased work of breathing that occurred earlier today.  Patient denies any other acute symptoms including chest pain, fevers, chills, headache, earache, sore throat, dental pain, back pain, nausea, vomiting, diarrhea, dysuria, rash or any other acute symptoms.  No recent falls or injuries.  She is unable to clarify further details of her medical history secondary to her dementia but her son states that she receives 24-hour care at home.  Patient does still smoke and has never been diagnosed with COPD although has been smoking for decades.  Patient did receive 1 albuterol treatment in route with EMS she states this significantly improved her work of breathing.   Past Medical History:  Diagnosis Date  . Arthritis   . Dementia (HCC)   . Stroke [facility])     Patient Active Problem List   Diagnosis Date Noted  . Sepsis (HCC) 01/07/2020  . Sepsis secondary to UTI (HCC) 01/07/2020  . Mass of hard palate 01/07/2020  . Dysarthria 12/29/2018  . Tobacco use disorder 12/29/2018  . Vascular dementia (HCC) 12/29/2018  . Age-related osteoporosis without current pathological fracture 12/29/2018  . Acute ischemic stroke (HCC) 12/28/2018  . Mild protein-calorie malnutrition (HCC) 12/27/2017  . Vitamin D deficiency 12/20/2014  . Macular degeneration 05/24/2013  . Rheumatoid arthritis (HCC) 05/03/2013    History reviewed. No pertinent  surgical history.  Prior to Admission medications   Medication Sig Start Date End Date Taking? Authorizing Provider  albuterol (VENTOLIN HFA) 108 (90 Base) MCG/ACT inhaler Inhale 2-4 puffs into the lungs every 4 (four) hours as needed for wheezing or shortness of breath. 02/24/20  Yes Gilles Chiquito, MD  doxycycline (VIBRAMYCIN) 100 MG capsule Take 1 capsule (100 mg total) by mouth 2 (two) times daily for 7 days. 02/24/20 03/02/20 Yes Gilles Chiquito, MD  predniSONE (DELTASONE) 10 MG tablet Take 4 tablets (40 mg total) by mouth daily for 4 days. 02/24/20 02/28/20 Yes Gilles Chiquito, MD  aspirin EC 81 MG tablet Take 81 mg by mouth daily. Swallow whole.    [provider]  cholecalciferol (VITAMIN D3) 25 MCG (1000 UNIT) tablet Take 2,000 Units by mouth daily.    [provider]  donepezil (ARICEPT) 10 MG tablet Take 10 mg by mouth daily. 11/25/18   [provider]  folic acid (FOLVITE) 1 MG tablet Take 1 mg by mouth daily.    [provider]  ibuprofen (ADVIL) 200 MG tablet Take 400 mg by mouth every 6 (six) hours as needed for fever or moderate pain.    [provider]  methotrexate 2.5 MG tablet Take 20 mg by mouth every Sunday.  11/30/18   [provider]  mirtazapine (REMERON) 7.5 MG tablet Take 7.5 mg by mouth at bedtime. 11/13/19   [provider]    Allergies Atorvastatin, Codeine, Propoxyphene, and Pneumococcal polysaccharide vaccine  Family History  Problem Relation Age of Onset  . Hypertension Mother   . Hypertension Father  Social History Social History   Tobacco Use  . Smoking status: Current Every Day Smoker    Packs/day: 0.50    Types: Cigarettes  . Smokeless tobacco: Never Used  Substance Use Topics  . Alcohol use: Yes    Comment: occasional wine    Review of Systems  Review of Systems  Constitutional: Negative for chills and fever.  HENT: Negative for sore throat.   Eyes: Negative for pain.   Respiratory: Positive for cough, shortness of breath and wheezing. Negative for stridor.   Cardiovascular: Negative for chest pain.  Gastrointestinal: Negative for vomiting.  Genitourinary: Negative for dysuria.  Musculoskeletal: Negative for myalgias.  Skin: Negative for rash.  Neurological: Negative for seizures, loss of consciousness and headaches.  Psychiatric/Behavioral: Negative for suicidal ideas.  All other systems reviewed and are negative.     ____________________________________________   PHYSICAL EXAM:  VITAL SIGNS: ED Triage Vitals  Enc Vitals Group     BP 02/23/20 2135 133/82     Pulse Rate 02/23/20 2135 92     Resp 02/23/20 2135 (!) 28     Temp 02/23/20 2135 99.2 F (37.3 C)     Temp Source 02/23/20 2135 Oral     SpO2 02/23/20 2135 97 %     Weight 02/23/20 2136 101 lb (45.8 kg)     Height 02/23/20 2136  (1.6 m)     Head Circumference --      Peak Flow --      Pain Score 02/23/20 2136 0     Pain Loc --      Pain Edu? --      Excl. in GC? --    Vitals:   02/23/20 2320 02/24/20 0202  BP: 134/85 133/73  Pulse: 80 80  Resp: 20 18  Temp:    SpO2: 96% 96%   Physical Exam Vitals and nursing note reviewed.  Constitutional:      General: She is not in acute distress.    Appearance: She is well-developed and well-nourished.  HENT:     Head: Normocephalic and atraumatic.     Right Ear: External ear normal.     Left Ear: External ear normal.     Nose: Nose normal.  Eyes:     Conjunctiva/sclera: Conjunctivae normal.  Cardiovascular:     Rate and Rhythm: Normal rate and regular rhythm.     Heart sounds: No murmur heard.   Pulmonary:     Effort: Pulmonary effort is normal. No respiratory distress.     Breath sounds: Wheezing present.  Abdominal:     Palpations: Abdomen is soft.     Tenderness: There is no abdominal tenderness.  Musculoskeletal:        General: No edema.     Cervical back: Neck supple.  Skin:    General: Skin is warm and  dry.     Capillary Refill: Capillary refill takes less than 2 seconds.  Neurological:     Mental Status: She is alert and oriented to person, place, and time.  Psychiatric:        Mood and Affect: Mood and affect and mood normal.      ____________________________________________   LABS (all labs ordered are listed, but only abnormal results are displayed)  Labs Reviewed  BASIC METABOLIC PANEL - Abnormal; Notable for the following components:      Result Value   Glucose, Bld 115 (*)    BUN 37 (*)    Creatinine, Ser 1.02 (*)  GFR, Estimated 55 (*)    All other components within normal limits  CBC - Abnormal; Notable for the following components:   WBC 13.9 (*)    MCV 100.5 (*)    RDW 16.0 (*)    Platelets 413 (*)    All other components within normal limits  SARS CORONAVIRUS 2 (TAT 6-24 HRS)  PROCALCITONIN  POC SARS CORONAVIRUS 2 AG -  ED  TROPONIN I (HIGH SENSITIVITY)  TROPONIN I (HIGH SENSITIVITY)   ____________________________________________  EKG  Sinus rhythm with a ventricular rate of 94, left axis deviation, unremarkable intervals, no clear evidence of acute ischemia. ____________________________________________  RADIOLOGY  ED MD interpretation: No pneumothorax, large effusion, overt edema, focal consolidation or other clear acute thoracic process.   Official radiology report(s): DG Chest 2 View  Result Date: 02/23/2020 CLINICAL DATA:  86 year old female with shortness of breath. EXAM: CHEST - 2 VIEW COMPARISON:  Chest radiograph dated 01/07/2020 FINDINGS: No focal consolidation, pleural effusion, pneumothorax. Cardiac silhouette is within limits. Atherosclerotic calcification of the aorta. Degenerative changes of the spine. No acute osseous pathology. IMPRESSION: No active cardiopulmonary disease. Electronically Signed   By: Elgie Collard M.D.   On: 02/23/2020 22:28    ____________________________________________   PROCEDURES  Procedure(s) performed  (including Critical Care):  .1-3 Lead EKG Interpretation Performed by: Gilles Chiquito, MD Authorized by: Gilles Chiquito, MD     Interpretation: normal     ECG rate assessment: normal     Rhythm: sinus rhythm     Ectopy: none     Conduction: normal       ____________________________________________   INITIAL IMPRESSION / ASSESSMENT AND PLAN / ED COURSE      Patient presents with above to history exam for assessment of approximately 1 day of cough and shortness of breath associate with some wheezing in the setting of ongoing tobacco abuse.  Patient is afebrile and hemodynamically stable on my exam.  She does have some end expiratory wheezes but otherwise has no significant work of breathing.  Per history from patient and son is vastly improved after she received some albuterol with EMS.  Primary differential includes but is not limited to pneumonia, pneumothorax, heart failure, PE, ACS, anemia, and bronchitis.  Low suspicion for ACS given reassuring EKG and patient denying any chest pain with nonelevated troponin x2.  Patient does not appear grossly volume overload on exam and there is no pulmonary edema on x-ray or history of heart failure would suggest heart failure exacerbation at this time.  No evidence of focal consolidation suggestive of clear focal bacterial pneumonia.  Low suspicion for PE given patient has no evidence of hypoxic respiratory failure, tachypnea, tachycardia and states her work of breathing and wheezing vastly improved after single albuterol treatment.  In addition I suspect patient likely has undiagnosed COPD and COPD exacerbation seems more likely than PE at this time.  BMP shows no significant electrolyte or metabolic derangements.  CBC shows slight leukocytosis with WBC count of 13.9 with no evidence of acute anemia and relatively unremarkable platelets.  On my reassessment after additional DuoNeb treatment patient had provement in her wheezing and work of  breathing.  She has no evidence of hypercarbic respiratory failure.  Given concern for COPD exacerbation she was given dose of steroids antibiotics emergency room.  Edition Rx written for albuterol, antibiotics and prednisone.  Counseled patient on tobacco cessation.  Rapid Covid is negative and PCR was sent.  Patient and family at bedside advised on  appropriate isolation precautions pending results of PCR.  Discharge stable condition.  Strict return cautions advised and discussed.  Plan is for outpatient follow-up with patient's PCP in the next couple of days.     ____________________________________________   FINAL CLINICAL IMPRESSION(S) / ED DIAGNOSES  Final diagnoses:  COPD exacerbation (HCC)  Encounter for tobacco use cessation counseling  Person under investigation for COVID-19    Medications  azithromycin (ZITHROMAX) tablet 500 mg (has no administration in time range)  ipratropium-albuterol (DUONEB) 0.5-2.5 (3) MG/3ML nebulizer solution 3 mL (3 mLs Nebulization Given 02/24/20 0404)  methylPREDNISolone sodium succinate (SOLU-MEDROL) 125 mg/2 mL injection 125 mg (125 mg Intravenous Given 02/24/20 0404)  cefTRIAXone (ROCEPHIN) 1 g in sodium chloride 0.9 % 100 mL IVPB (1 g Intravenous New Bag/Given 02/24/20 0403)     ED Discharge Orders         Ordered    doxycycline (VIBRAMYCIN) 100 MG capsule  2 times daily        02/24/20 0457    albuterol (VENTOLIN HFA) 108 (90 Base) MCG/ACT inhaler  Every 4 hours PRN        02/24/20 0457    predniSONE (DELTASONE) 10 MG tablet  Daily        02/24/20 0457           Note:  This document was prepared using Dragon voice recognition software and may include unintentional dictation errors.   Gilles Chiquito, MD 02/24/20 [phone]

## 2020-06-24 ENCOUNTER — Other Ambulatory Visit: Payer: Self-pay | Admitting: Internal Medicine

## 2020-06-24 ENCOUNTER — Other Ambulatory Visit (HOSPITAL_COMMUNITY): Payer: Self-pay | Admitting: Internal Medicine

## 2020-06-24 DIAGNOSIS — R310 Gross hematuria: Secondary | ICD-10-CM

## 2020-07-16 ENCOUNTER — Ambulatory Visit
Admission: RE | Admit: 2020-07-16 | Discharge: 2020-07-16 | Disposition: A | Discharge status: Home / Self Care | Payer: Medicare Other | Source: Ambulatory Visit | Attending: Internal Medicine | Admitting: Internal Medicine

## 2020-07-16 ENCOUNTER — Other Ambulatory Visit: Payer: Self-pay

## 2020-07-16 DIAGNOSIS — R310 Gross hematuria: Secondary | ICD-10-CM | POA: Diagnosis not present

## 2022-11-25 IMAGING — CT CT RENAL STONE PROTOCOL
2 of 4 series · 17 of 46 positions shown, 19 images · non-contrast
Comparison: 01/07/2020

CLINICAL DATA: Bilateral flank pain

EXAM:
CT ABDOMEN AND PELVIS WITHOUT CONTRAST
TECHNIQUE: Multidetector CT imaging of the abdomen and pelvis was performed
following the standard protocol without IV contrast.

[Series 2: stone full standard · axial · 0.65mm/px · z∈[-447,-87]mm · 14 of 80 slices shown, 16 images]
[im 4/80  soft-tissue]
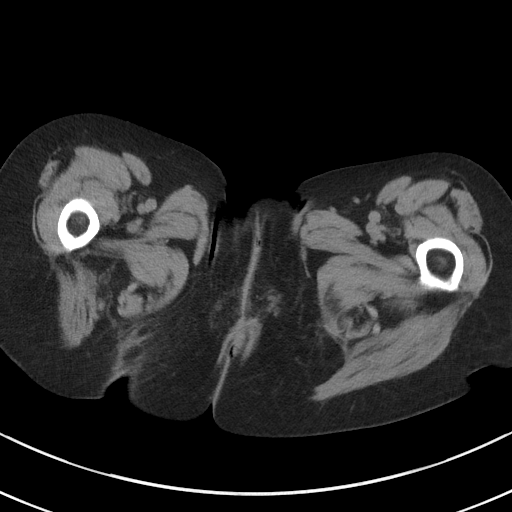
[im 4/80  bone]
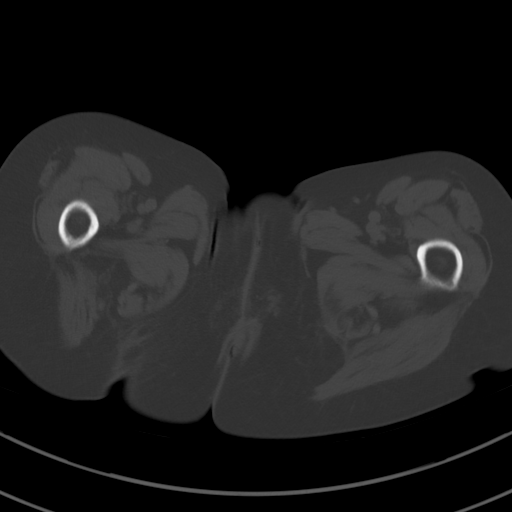
[im 10/80  soft-tissue]
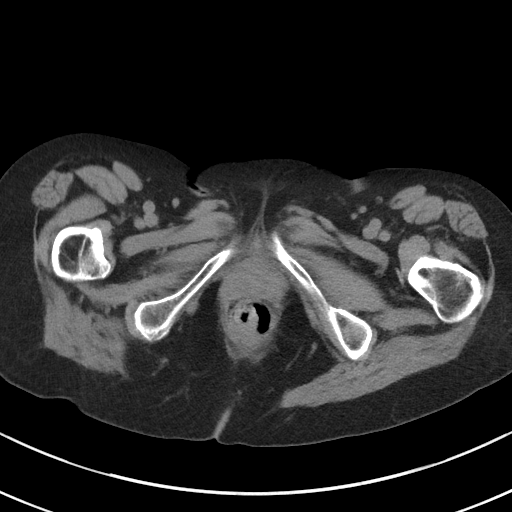
[im 17/80  soft-tissue]
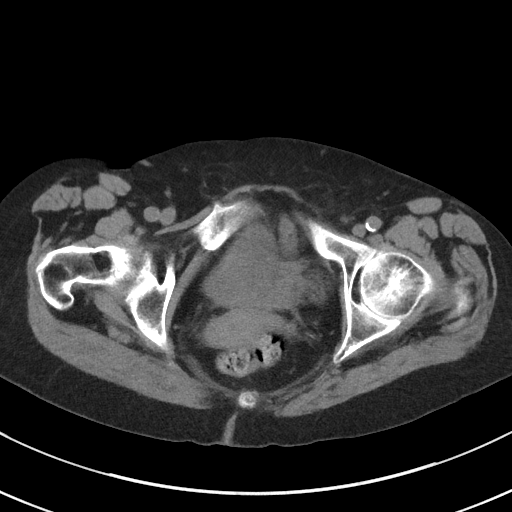
[im 20/80  soft-tissue]
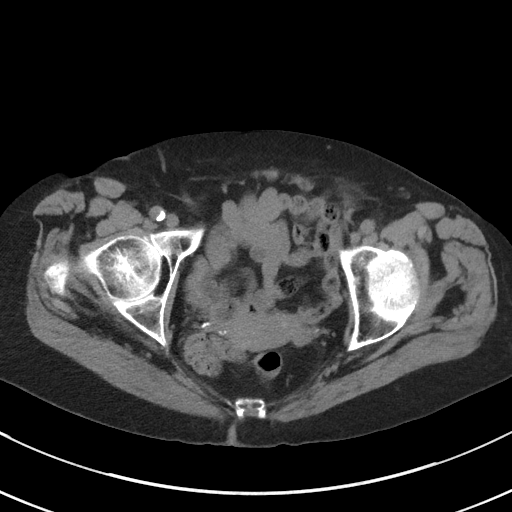
[im 27/80  soft-tissue]
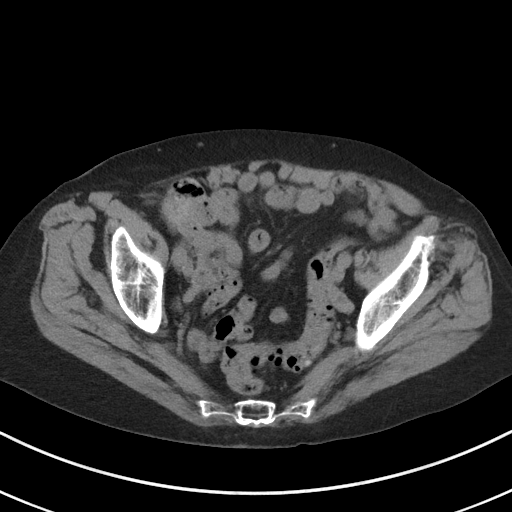
[im 33/80  soft-tissue]
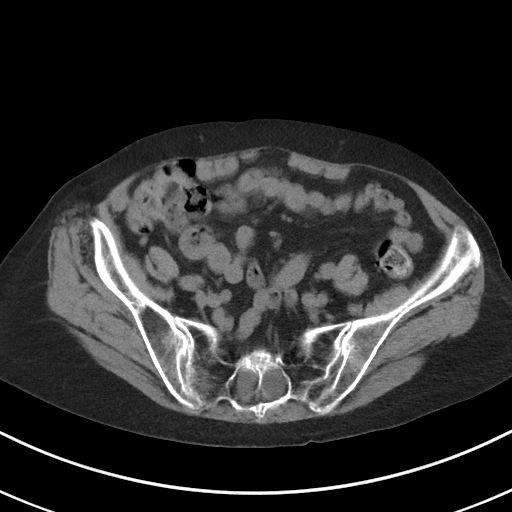
[im 37/80  soft-tissue]
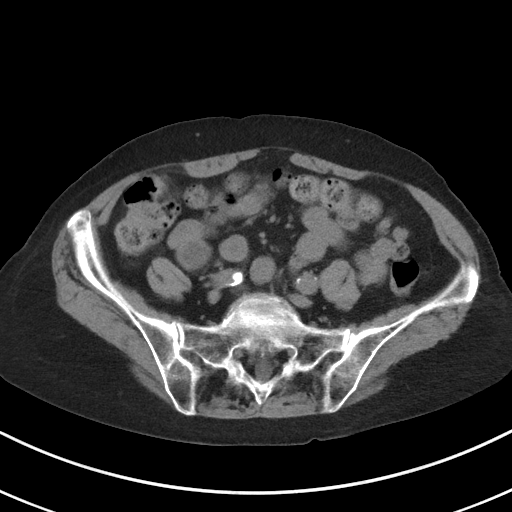
[im 43/80  soft-tissue]
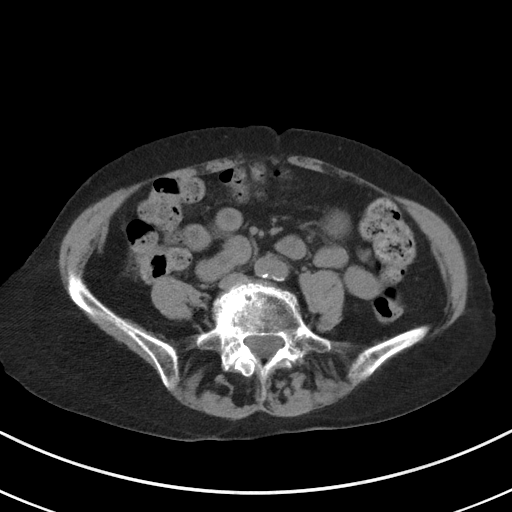
[im 47/80  soft-tissue]
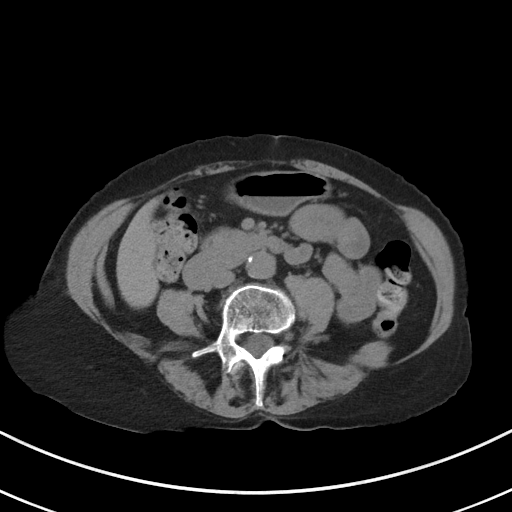
[im 47/80  bone]
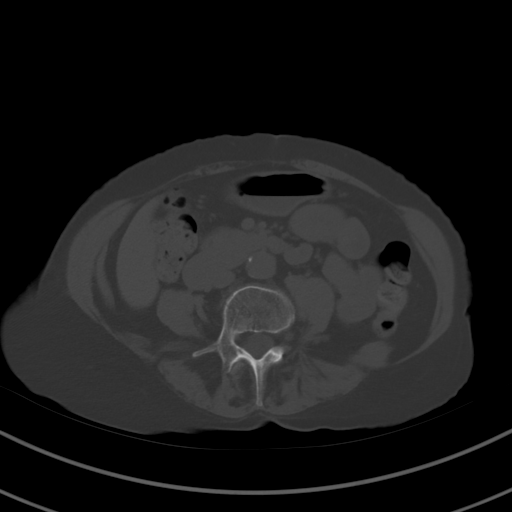
[im 53/80  soft-tissue]
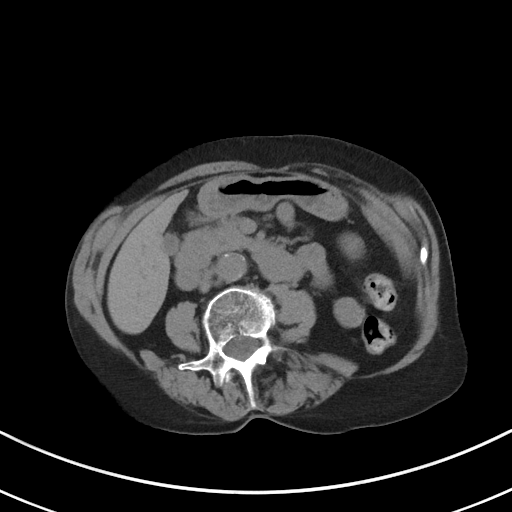
[im 60/80  soft-tissue]
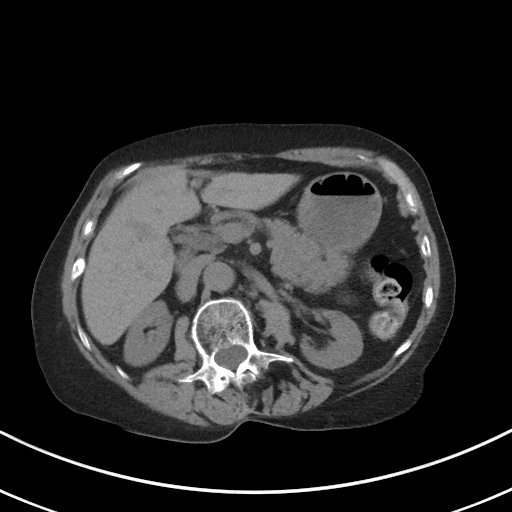
[im 63/80  soft-tissue]
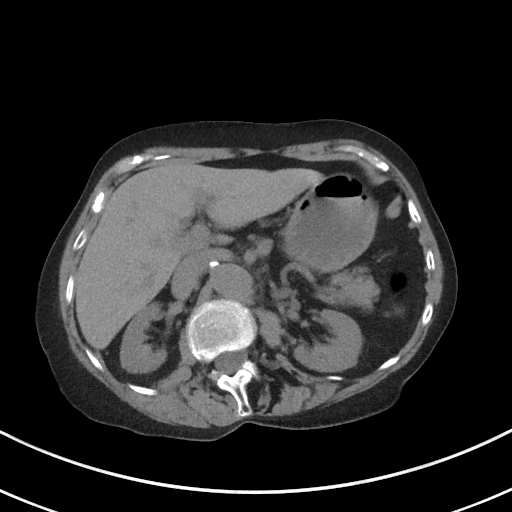
[im 70/80  soft-tissue]
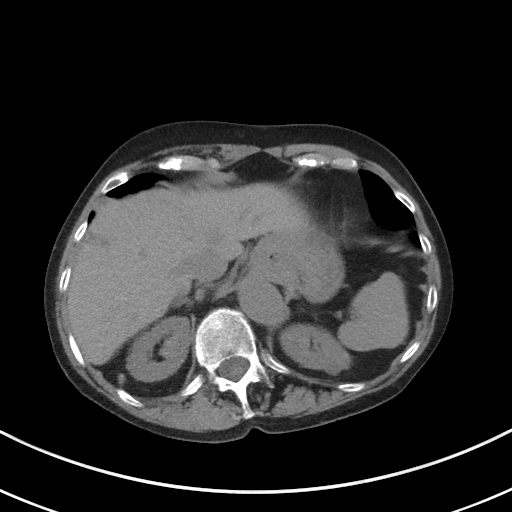
[im 76/80  soft-tissue]
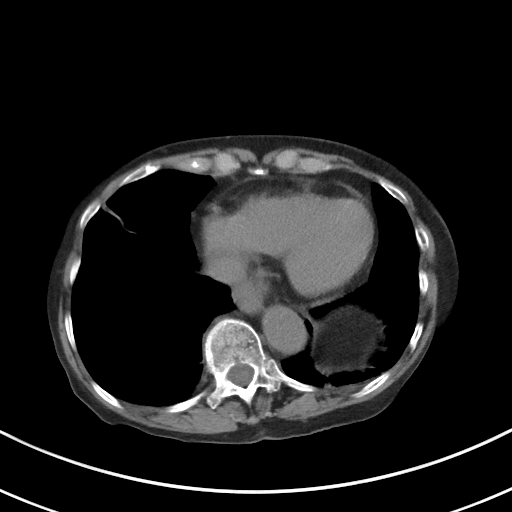

[Series 4: coronal · coronal · 0.75mm/px · 3 of 113 slices shown]
[im 38/113  soft-tissue]
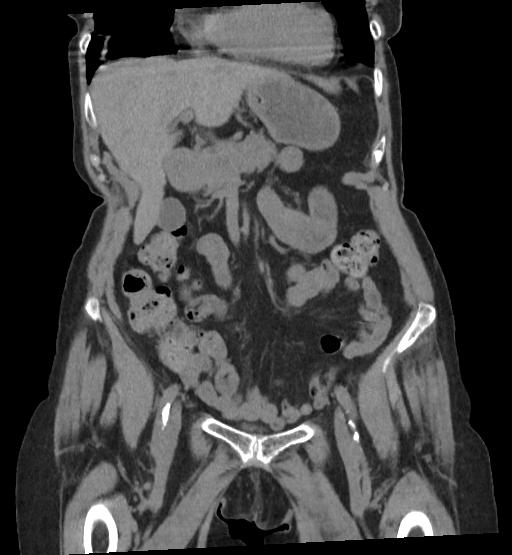
[im 50/113  soft-tissue]
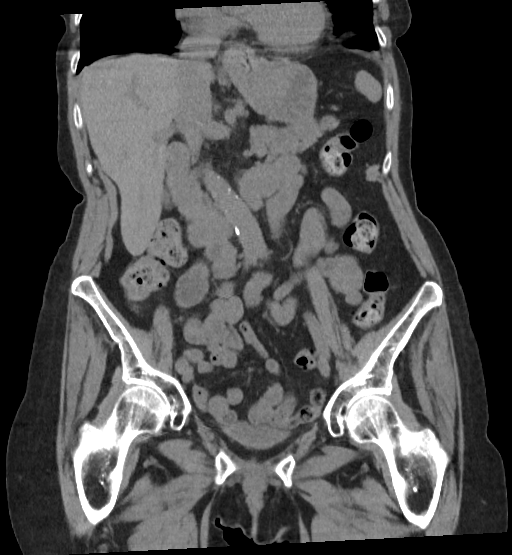
[im 63/113  soft-tissue]
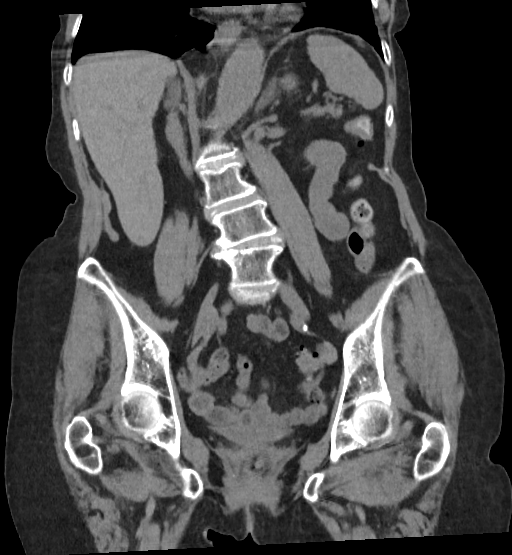

[17 of 46 positions shown; findings below may reference images not displayed]

FINDINGS: Lower chest: Lung bases demonstrate mild scarring similar to that
seen on prior exams.

Hepatobiliary: No focal liver abnormality is seen. No gallstones,
gallbladder wall thickening, or biliary dilatation.

Pancreas: Unremarkable. No pancreatic ductal dilatation or
surrounding inflammatory changes.

Spleen: Normal in size without focal abnormality.

Adrenals/Urinary Tract: Adrenal glands are within normal limits.
Kidneys are well visualize without renal calculi or obstructive
changes. Scattered hypodensities are noted consistent with cysts
similar to that seen on the prior exam. No focal mass is seen. The
bladder is decompressed.

Stomach/Bowel: Scattered diverticular change of the sigmoid colon is
noted. No changes to suggest diverticulitis are noted. The appendix
is within normal limits. Small bowel is within normal limits.
Stomach demonstrates a sliding-type hiatal hernia.

Vascular/Lymphatic: Aortic atherosclerosis. No enlarged abdominal or
pelvic lymph nodes.

Reproductive: Uterus and bilateral adnexa are unremarkable.

Other: No abdominal wall hernia or abnormality. No abdominopelvic
ascites.

Musculoskeletal: Scoliosis is noted concave to the left at the
thoracolumbar junction. Mild degenerative changes of lumbar spine
are seen.
IMPRESSION: Diverticulosis without diverticulitis.

Chronic changes similar to that seen on the prior exam.

No acute abnormality to correspond with the patient's given clinical
history.

## 2024-02-28 ENCOUNTER — Other Ambulatory Visit: Payer: Self-pay

## 2024-02-28 DIAGNOSIS — W19XXXA Unspecified fall, initial encounter: Secondary | ICD-10-CM

## 2024-02-28 DIAGNOSIS — M25551 Pain in right hip: Secondary | ICD-10-CM
